# Patient Record
Sex: Male | Born: 1995
Health system: Southern US, Community
[De-identification: ages and names within clinical notes are randomized; demographics above are authoritative.]

## PROBLEM LIST (undated history)

## (undated) ENCOUNTER — Emergency Department (HOSPITAL_COMMUNITY): Admission: EM | Payer: BLUE CROSS/BLUE SHIELD | Source: Home / Self Care

## (undated) DIAGNOSIS — F909 Attention-deficit hyperactivity disorder, unspecified type: Secondary | ICD-10-CM

## (undated) DIAGNOSIS — J029 Acute pharyngitis, unspecified: Secondary | ICD-10-CM

## (undated) DIAGNOSIS — F419 Anxiety disorder, unspecified: Secondary | ICD-10-CM

## (undated) DIAGNOSIS — F32A Depression, unspecified: Secondary | ICD-10-CM

## (undated) DIAGNOSIS — F1010 Alcohol abuse, uncomplicated: Secondary | ICD-10-CM

## (undated) DIAGNOSIS — F329 Major depressive disorder, single episode, unspecified: Secondary | ICD-10-CM

## (undated) HISTORY — DX: Acute pharyngitis, unspecified: J02.9

## (undated) HISTORY — PX: WISDOM TOOTH EXTRACTION: SHX21

## (undated) HISTORY — DX: Attention-deficit hyperactivity disorder, unspecified type: F90.9

---
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
## 2015-07-13 ENCOUNTER — Ambulatory Visit (INDEPENDENT_AMBULATORY_CARE_PROVIDER_SITE_OTHER): Payer: BLUE CROSS/BLUE SHIELD | Admitting: Physician Assistant

## 2015-07-13 ENCOUNTER — Encounter: Payer: Self-pay | Admitting: Physician Assistant

## 2015-07-13 VITALS — BP 127/64 | HR 97 | Temp 98.1°F | Resp 16 | Ht 68.5 in | Wt 160.0 lb

## 2015-07-13 DIAGNOSIS — B9689 Other specified bacterial agents as the cause of diseases classified elsewhere: Secondary | ICD-10-CM | POA: Insufficient documentation

## 2015-07-13 DIAGNOSIS — J00 Acute nasopharyngitis [common cold]: Secondary | ICD-10-CM | POA: Diagnosis not present

## 2015-07-13 DIAGNOSIS — J208 Acute bronchitis due to other specified organisms: Principal | ICD-10-CM

## 2015-07-13 MED ORDER — AZITHROMYCIN 250 MG PO TABS: ORAL_TABLET | ORAL | Status: DC

## 2015-07-13 MED ORDER — BENZONATATE 200 MG PO CAPS: 200.0000 mg | ORAL_CAPSULE | Freq: Three times a day (TID) | ORAL | Status: DC | PRN

## 2015-07-13 NOTE — Assessment & Plan Note (Signed)
Rx Azithromycin.  Increase fluids.  Rest.  Saline nasal spray.  Probiotic.  Mucinex as directed.  Humidifier in bedroom. Tessalon for cough.  [place] or return to clinic if symptoms are not improving.  

## 2015-07-13 NOTE — Progress Notes (Signed)
   Patient presents to clinic today c/o 1 week of chest congestion, cough, nasal congestion and fatigue. Endorses significant PND and sore throat. Denies fever, recent travel, or sick contact.  Has been taking Mucinex with little relief in symptoms.  Past Medical History  Diagnosis Date  . Depression   . Anxiety   . ADHD (attention deficit hyperactivity disorder)     No current outpatient prescriptions on file prior to visit.   No current facility-administered medications on file prior to visit.    No Known Allergies  Family History  Problem Relation Age of Onset  . Healthy Mother     Living  . Healthy Father     Living  . Heart attack Maternal Grandfather   . Healthy Sister     x1  . Bone cancer Paternal Grandfather     Social History   Social History  . Marital Status: Single    Spouse Name: N/A  . Number of Children: N/A  . Years of Education: N/A   Social History Main Topics  . Smoking status: Current Every Day Smoker -- 0.50 packs/day    Types: Cigarettes  . Smokeless tobacco: Never Used  . Alcohol Use: 0.0 oz/week    0 Standard drinks or equivalent per week  . Drug Use: 4.00 per week    Special: Marijuana     Comment: prescription opiates, benzos, antidepressants, amphetamines  . Sexual Activity: Yes    Birth Control/ Protection: Condom   Other Topics Concern  . None   Social History Narrative   Review of Systems - See HPI.  All other ROS are negative.  BP 127/64 mmHg  Pulse 97  Temp(Src) 98.1 F (36.7 C) (Oral)  Resp 16  Ht 5' 8.5" (1.74 m)  Wt 160 lb (72.576 kg)  BMI 23.97 kg/m2  SpO2 99%  Physical Exam  Constitutional: He is oriented to person, place, and time and well-developed, well-nourished, and in no distress.  HENT:  Head: Normocephalic and atraumatic.  Right Ear: External ear normal.  Left Ear: External ear normal.  Nose: Nose normal.  Mouth/Throat: Oropharynx is clear and moist. No oropharyngeal exudate.  TM within normal  limits bilaterally.  Eyes: Conjunctivae are normal.  Neck: Neck supple.  Cardiovascular: Normal rate, regular rhythm, normal heart sounds and intact distal pulses.   Pulmonary/Chest: Effort normal and breath sounds normal. No respiratory distress. He has no wheezes. He has no rales. He exhibits no tenderness.  Neurological: He is alert and oriented to person, place, and time.  Skin: Skin is warm and dry. No rash noted.  Psychiatric: Affect normal.  Vitals reviewed.   No results found for this or any previous visit (from the past 2160 hour(s)).  Assessment/Plan: Acute bacterial bronchitis Rx Azithromycin.  Increase fluids.  Rest.  Saline nasal spray.  Probiotic.  Mucinex as directed.  Humidifier in bedroom. Tessalon for cough.  [place] or return to clinic if symptoms are not improving.

## 2015-07-13 NOTE — Patient Instructions (Signed)
Take antibiotic (Azithromycin) as directed.  Increase fluids.  Get plenty of rest. Use Tessalon as directed for cough. Take a daily probiotic (I recommend Align or Culturelle, but even Activia Yogurt may be beneficial).  A humidifier placed in the bedroom may offer some relief for a dry, scratchy throat of nasal irritation.  Read information below on acute bronchitis. Please [place] or return to clinic if symptoms are not improving.  Acute Bronchitis Bronchitis is when the airways that extend from the windpipe into the lungs get red, puffy, and painful (inflamed). Bronchitis often causes thick spit (mucus) to develop. This leads to a cough. A cough is the most common symptom of bronchitis. In acute bronchitis, the condition usually begins suddenly and goes away over time (usually in 2 weeks). Smoking, allergies, and asthma can make bronchitis worse. Repeated episodes of bronchitis may cause more lung problems.  HOME CARE  Rest.  Drink enough fluids to keep your pee (urine) clear or pale yellow (unless you need to limit fluids as told by your doctor).  Only take over-the-counter or prescription medicines as told by your doctor.  Avoid smoking and secondhand smoke. These can make bronchitis worse. If you are a smoker, think about using nicotine gum or skin patches. Quitting smoking will help your lungs heal faster.  Reduce the chance of getting bronchitis again by:  Washing your hands often.  Avoiding people with cold symptoms.  Trying not to touch your hands to your mouth, nose, or eyes.  Follow up with your doctor as told.  GET HELP IF: Your symptoms do not improve after 1 week of treatment. Symptoms include:  Cough.  Fever.  Coughing up thick spit.  Body aches.  Chest congestion.  Chills.  Shortness of breath.  Sore throat.  GET HELP RIGHT AWAY IF:   You have an increased fever.  You have chills.  You have severe shortness of breath.  You have bloody thick spit  (sputum).  You throw up (vomit) often.  You lose too much body fluid (dehydration).  You have a severe headache.  You faint.  MAKE SURE YOU:   Understand these instructions.  Will watch your condition.  Will get help right away if you are not doing well or get worse. Document Released: 02/15/2008 Document Revised: 05/01/2013 Document Reviewed: 02/19/2013 Exit[employer] Patient Information 2015 [employer]. This information is not intended to replace advice given to you by your health care provider. Make sure you discuss any questions you have with your health care provider.   

## 2015-07-13 NOTE — Progress Notes (Signed)
Pre visit review using our clinic review tool, if applicable. No additional management support is needed unless otherwise documented below in the visit note/SLS  

## 2015-09-01 ENCOUNTER — Telehealth: Payer: Self-pay | Admitting: Physician Assistant

## 2015-09-01 NOTE — Telephone Encounter (Signed)
Noted in HM. 

## 2015-09-01 NOTE — Telephone Encounter (Signed)
Pt declined having his flu vac

## 2015-12-30 ENCOUNTER — Ambulatory Visit: Payer: Self-pay | Admitting: Physician Assistant

## 2015-12-30 ENCOUNTER — Emergency Department (HOSPITAL_BASED_OUTPATIENT_CLINIC_OR_DEPARTMENT_OTHER): Payer: BLUE CROSS/BLUE SHIELD

## 2015-12-30 ENCOUNTER — Ambulatory Visit: Payer: BLUE CROSS/BLUE SHIELD | Admitting: Physician Assistant

## 2015-12-30 ENCOUNTER — Telehealth: Payer: Self-pay | Admitting: Physician Assistant

## 2015-12-30 ENCOUNTER — Emergency Department (HOSPITAL_BASED_OUTPATIENT_CLINIC_OR_DEPARTMENT_OTHER)
Admission: EM | Admit: 2015-12-30 | Discharge: 2015-12-30 | Disposition: A | Discharge status: Home / Self Care | Payer: BLUE CROSS/BLUE SHIELD | Attending: Emergency Medicine | Admitting: Emergency Medicine

## 2015-12-30 ENCOUNTER — Other Ambulatory Visit: Payer: Self-pay

## 2015-12-30 ENCOUNTER — Encounter (HOSPITAL_BASED_OUTPATIENT_CLINIC_OR_DEPARTMENT_OTHER): Payer: Self-pay

## 2015-12-30 DIAGNOSIS — R112 Nausea with vomiting, unspecified: Secondary | ICD-10-CM | POA: Diagnosis not present

## 2015-12-30 DIAGNOSIS — J069 Acute upper respiratory infection, unspecified: Secondary | ICD-10-CM | POA: Diagnosis not present

## 2015-12-30 DIAGNOSIS — R0789 Other chest pain: Secondary | ICD-10-CM

## 2015-12-30 DIAGNOSIS — Z8659 Personal history of other mental and behavioral disorders: Secondary | ICD-10-CM | POA: Insufficient documentation

## 2015-12-30 DIAGNOSIS — F17200 Nicotine dependence, unspecified, uncomplicated: Secondary | ICD-10-CM | POA: Diagnosis not present

## 2015-12-30 DIAGNOSIS — R05 Cough: Secondary | ICD-10-CM | POA: Diagnosis present

## 2015-12-30 HISTORY — DX: Alcohol abuse, uncomplicated: F10.10

## 2015-12-30 LAB — CBC WITH DIFFERENTIAL/PLATELET
Basophils Absolute: 0 10*3/uL (ref 0.0–0.1)
Basophils Relative: 0 %
EOS PCT: 0 %
Eosinophils Absolute: 0 10*3/uL (ref 0.0–0.7)
HCT: 45.3 % (ref 39.0–52.0)
HEMOGLOBIN: 16.7 g/dL (ref 13.0–17.0)
LYMPHS ABS: 1.5 10*3/uL (ref 0.7–4.0)
LYMPHS PCT: 14 %
MCH: 31.4 pg (ref 26.0–34.0)
MCHC: 36.9 g/dL — AB (ref 30.0–36.0)
MCV: 85.2 fL (ref 78.0–100.0)
MONOS PCT: 12 %
Monocytes Absolute: 1.4 10*3/uL — ABNORMAL HIGH (ref 0.1–1.0)
Neutro Abs: 8.2 10*3/uL — ABNORMAL HIGH (ref 1.7–7.7)
Neutrophils Relative %: 74 %
Platelets: 248 10*3/uL (ref 150–400)
RBC: 5.32 MIL/uL (ref 4.22–5.81)
RDW: 11.4 % — ABNORMAL LOW (ref 11.5–15.5)
WBC: 11 10*3/uL — ABNORMAL HIGH (ref 4.0–10.5)

## 2015-12-30 LAB — COMPREHENSIVE METABOLIC PANEL
ALT: 24 U/L (ref 17–63)
AST: 25 U/L (ref 15–41)
Albumin: 4.8 g/dL (ref 3.5–5.0)
Alkaline Phosphatase: 61 U/L (ref 38–126)
Anion gap: 11 (ref 5–15)
BILIRUBIN TOTAL: 0.8 mg/dL (ref 0.3–1.2)
BUN: 11 mg/dL (ref 6–20)
CO2: 26 mmol/L (ref 22–32)
CREATININE: 0.95 mg/dL (ref 0.61–1.24)
Calcium: 9.3 mg/dL (ref 8.9–10.3)
Chloride: 99 mmol/L — ABNORMAL LOW (ref 101–111)
GFR calc Af Amer: >60 mL/min (ref >60)
Glucose, Bld: 90 mg/dL (ref 65–99)
Potassium: 3.5 mmol/L (ref 3.5–5.1)
Sodium: 136 mmol/L (ref 135–145)
TOTAL PROTEIN: 8.1 g/dL (ref 6.5–8.1)

## 2015-12-30 LAB — D-DIMER, QUANTITATIVE: D-Dimer, Quant: <0.27 ug/mL-FEU (ref 0.00–0.50)

## 2015-12-30 LAB — TROPONIN I

## 2015-12-30 MED ORDER — KETOROLAC TROMETHAMINE 30 MG/ML IJ SOLN
30.0000 mg | Freq: Once | INTRAMUSCULAR | Status: AC
  Administered 2015-12-30: 30 mg via INTRAVENOUS
  Filled 2015-12-30: qty 1

## 2015-12-30 MED ORDER — NAPROXEN 500 MG PO TABS: 500.0000 mg | ORAL_TABLET | Freq: Two times a day (BID) | ORAL | Status: DC

## 2015-12-30 MED ORDER — SODIUM CHLORIDE 0.9 % IV BOLUS (SEPSIS)
1000.0000 mL | Freq: Once | INTRAVENOUS | Status: AC
  Administered 2015-12-30: 1000 mL via INTRAVENOUS

## 2015-12-30 MED FILL — NAPROXEN 500 MG TABLET: 500 | 7 days supply | Qty: 14 | Fill #0

## 2015-12-30 NOTE — ED Provider Notes (Addendum)
CSN: [id number]     Arrival date & time 12/30/15  1150 History   First MD Initiated Contact with Patient 12/30/15 1336     Chief Complaint  Patient presents with  . Cough     (Consider location/radiation/quality/duration/timing/severity/associated sxs/prior Treatment) HPI  20 year old male presents with a chief complaint of chest pain. Patient states he's been having flulike symptoms over last several days. Last couple days, especially last night he's been having chest pain. It is in the middle and left side of his chest. It is sharp in nature. When he is taken a deep breath for the x-ray he states his pain was worse. No exertional pain. Has not taken anything for the pain. Feels like someone stabbing him in the chest. No shortness of breath. Started off with sore throat, myalgias, and mild dry cough. Patient states she's been having nausea and vomiting for several months. Currently his chest is worse today than it was yesterday. No radiation of the pain.  Past Medical History  Diagnosis Date  . Depression   . Anxiety   . ADHD (attention deficit hyperactivity disorder)   . ETOH abuse    Past Surgical History  Procedure Laterality Date  . Wisdom tooth extraction     Family History  Problem Relation Age of Onset  . Healthy Mother     Living  . Healthy Father     Living  . Heart attack Maternal Grandfather   . Healthy Sister     x1  . Bone cancer Paternal Grandfather    Social History  Substance Use Topics  . Smoking status: Current Every Day Smoker -- 0.00 packs/day  . Smokeless tobacco: Never Used  . Alcohol Use: No    Review of Systems  Constitutional: Positive for fever.  HENT: Positive for congestion and sore throat.   Respiratory: Positive for cough. Negative for shortness of breath.   Cardiovascular: Positive for chest pain. Negative for leg swelling.  Gastrointestinal: Positive for nausea and vomiting. Negative for abdominal pain.  Musculoskeletal: Negative for  back pain.  All other systems reviewed and are negative.     Allergies  Review of patient's allergies indicates no known allergies.  Home Medications   Prior to Admission medications   Not on File   BP 122/85 mmHg  Pulse 86  Temp(Src) 98.4 F (36.9 C) (Oral)  Resp 18  Ht  (1.803 m)  Wt 160 lb (72.576 kg)  BMI 22.33 kg/m2  SpO2 99% Physical Exam  Constitutional: He is oriented to person, place, and time. He appears well-developed and well-nourished.  HENT:  Head: Normocephalic and atraumatic.  Right Ear: External ear normal.  Left Ear: External ear normal.  Nose: Nose normal.  Mouth/Throat: Oropharynx is clear and moist. No posterior oropharyngeal erythema or tonsillar abscesses.  Eyes: Right eye exhibits no discharge. Left eye exhibits no discharge.  Neck: Neck supple.  Cardiovascular: Normal rate, regular rhythm, normal heart sounds and intact distal pulses.   Pulmonary/Chest: Effort normal and breath sounds normal. He exhibits tenderness.    Abdominal: Soft. There is no tenderness.  Musculoskeletal: He exhibits no edema.  Neurological: He is alert and oriented to person, place, and time.  Skin: Skin is warm and dry.  Nursing note and vitals reviewed.   ED Course  Procedures (including critical care time) Labs Review Labs Reviewed  COMPREHENSIVE METABOLIC PANEL - Abnormal; Notable for the following:    Chloride 99 (*)    All other components within normal limits  CBC WITH DIFFERENTIAL/PLATELET - Abnormal; Notable for the following:    WBC 11.0 (*)    MCHC 36.9 (*)    RDW 11.4 (*)    Neutro Abs 8.2 (*)    Monocytes Absolute 1.4 (*)    All other components within normal limits  TROPONIN I  D-DIMER, QUANTITATIVE (NOT AT [facility])    Imaging Review Dg Chest 2 View  12/30/2015  CLINICAL DATA:  Fever, cough, chest pain EXAM: CHEST  2 VIEW COMPARISON:  None. FINDINGS: Lungs are clear.  No pleural effusion or pneumothorax. The heart is normal in size.  Visualized osseous structures are within normal limits. IMPRESSION: Normal chest radiographs. Electronically Signed   By: Charline BillsSriyesh  Krishnan M.D.   On: 12/30/2015 13:07   I have personally reviewed and evaluated these images and lab results as part of my medical decision-making.   EKG Interpretation   Date/Time:  Wednesday December 30 2015 11:53:50 EDT Ventricular Rate:  87 PR Interval:  162 QRS Duration: 88 QT Interval:  328 QTC Calculation: 394 R Axis:   74 Text Interpretation:  Normal sinus rhythm with sinus arrhythmia ST \\T \ T  wave abnormality, consider inferior ischemia Abnormal ECG changes noted  compared to Oct 2015 Confirmed by Criss AlvineGOLDSTON  MD, Jeromie Gainor [phone]([id number]) on 12/30/2015  12:06:50 PM       EKG Interpretation  Date/Time:  Wednesday December 30 2015 13:59:21 EDT Ventricular Rate:  78 PR Interval:  150 QRS Duration: 90 QT Interval:  334 QTC Calculation: 380 R Axis:   80 Text Interpretation:  Sinus rhythm Minimal ST depression, diffuse leads similar to ECG eaerlier in the day Confirmed by Jessie Cowher  MD, Genie Wenke ([id number]) on 12/30/2015 2:37:10 PM       MDM   Final diagnoses:  Chest wall pain  Upper respiratory infection    Patient's CP is atypical and appears to be coming from his chest wall. Likely from a costochondritis given recent URI/flu like illness. CP has been present for over 24 hours and has a negative troponin, do not feel further troponins are necessary. Is low risk for ACS, HEART score is 2. His EKG is not normal but nonspecific and not changing. I doubt ischemia. Low risk for PE, no risk factors, and ddimer is negative. Feels better with toradol. Will treat with NSAIDs, and recommend f/u with PCP. Discussed return precautions.    Pricilla LovelessScott Saroya Riccobono, MD 12/30/15 1514  Pricilla LovelessScott Taylyn Brame, MD 12/30/15 [phone]

## 2015-12-30 NOTE — ED Notes (Signed)
C/o "flu symptoms"-prod cough x last week fever yesterday-c/o CP x today-seen at urgent care for n/v x 3 days-stated on zofran and omeprazole-appt with PCP at 1:15pm but came to ED-NAD-steady gait

## 2015-12-30 NOTE — ED Notes (Signed)
Pt given d/c instructions as per chart. Verbalizes understanding. No questions. Rx x 1 

## 2015-12-30 NOTE — Telephone Encounter (Signed)
He would need to stay in the ER if they are already assessing. If he is in significant pain then there is a lot more they can do quickly for him there.

## 2015-12-30 NOTE — ED Notes (Signed)
Cough x couple days. Not able to eat or drink anything without gagging and/or vomiting sometimes for several months. [facility] and given Zofran and Omeprazole.

## 2015-12-30 NOTE — Telephone Encounter (Signed)
Caller name:Schulenburg,Bridget Relation to pt: mother  [place] back number:[phone]   Reason for [place]:  Mother called cancelling patient 1:15pm appointment due to patient being seen in the ED now and mother would like to know if PCP can intervene because its taking an hour for MRI results to come back and mother states patient is in pain.

## 2015-12-30 NOTE — Telephone Encounter (Signed)
LVM advising mother of message below

## 2015-12-31 ENCOUNTER — Encounter: Payer: Self-pay | Admitting: Internal Medicine

## 2015-12-31 ENCOUNTER — Ambulatory Visit (INDEPENDENT_AMBULATORY_CARE_PROVIDER_SITE_OTHER): Payer: BLUE CROSS/BLUE SHIELD | Admitting: Family Medicine

## 2015-12-31 ENCOUNTER — Encounter: Payer: Self-pay | Admitting: Family Medicine

## 2015-12-31 VITALS — BP 110/80 | HR 81 | Temp 97.8°F | Ht 68.5 in | Wt 175.0 lb

## 2015-12-31 DIAGNOSIS — R1310 Dysphagia, unspecified: Secondary | ICD-10-CM

## 2015-12-31 DIAGNOSIS — K219 Gastro-esophageal reflux disease without esophagitis: Secondary | ICD-10-CM | POA: Diagnosis not present

## 2015-12-31 DIAGNOSIS — F322 Major depressive disorder, single episode, severe without psychotic features: Secondary | ICD-10-CM

## 2015-12-31 MED ORDER — SUCRALFATE 1 G PO TABS: 1.0000 g | ORAL_TABLET | Freq: Three times a day (TID) | ORAL | Status: DC

## 2015-12-31 NOTE — Progress Notes (Signed)
[facility] [address] [place], [place] [address] [phone]  Date:  12/31/2015   Name:  Todd Vaughn   DOB:  1996/03/22   MRN:  [id number]  PCP:  Piedad Climes, PA-C    Chief Complaint: Follow-up and GI Problem   History of Present Illness:  Todd Vaughn is a 20 y.o. very pleasant male patient who presents with the following:  Pt was in the ER yesterday with complaint of chest pain and flu like sx.  EKG mildly abnl- he was asked to come in and see [place] today for further evaluation.    Yesterday had negative troponin an D dimer.  He also has concerns about GI problems and his mental health, as below.  Accompanied by his mom and grandmother today.   He is concerned about his GI system- he has noted some difficulty with swallowing over a couple of motnhs.  He does not feel like things get stuck per se but it can be just hard to get food and also liquids down sometimes.  He has also noted some gagging and vomiting daily for the last 2-3 months.  He does not have diarrhea Weight is stable. In fact he has gained a bit over the last 6 months.  He may notice some epigastric pain at time  He does have a history of anxiety and depression.   He has discussed this with his pediatrician and with Selena Batten in the past. He has done some therapy but is not in therapy currently. He has been on prozac, zoloft, ?buspar, wellbutrin in the past. He has had mood issues since a pretty young age- 74 or 59.  He had been in [facility] a couple of years ago- he took an overdose but states that he was not really trying to hurt himself.  "it was my way of asking for help"  He has not seen a psychiatrist ever but they would like to do this They do not have guns at home.  He denies active SI  Right now he is not in school or working.  He graduated high school last year. Hopes to begin working again soon. 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   There is a history of depression on his dad's side of the family.    He has a good support system- he indicates his mother, father and his GM as people he can count on.    Patient Active Problem List   Diagnosis Date Noted  . Acute bacterial bronchitis 07/13/2015  . Anxiety and depression 12/02/2014  . ADHD (attention deficit hyperactivity disorder), combined type 06/14/2014  . Cannabis use disorder, moderate, dependence (HCC) 06/14/2014    Past Medical History  Diagnosis Date  . Depression   . Anxiety   . ADHD (attention deficit hyperactivity disorder)   . ETOH abuse     Past Surgical History  Procedure Laterality Date  . Wisdom tooth extraction      Social History  Substance Use Topics  . Smoking status: Current Every Day Smoker -- 0.00 packs/day  . Smokeless tobacco: Never Used  . Alcohol Use: No    Family History  Problem Relation Age of Onset  . Healthy Mother     Living  . Healthy Father     Living  . Heart attack Maternal Grandfather   . Healthy Sister  x1  . Bone cancer Paternal Grandfather     No Known Allergies  Medication list has been reviewed and updated.  Current Outpatient Prescriptions on File Prior to Visit  Medication Sig Dispense Refill  . naproxen (NAPROSYN) 500 MG tablet Take 1 tablet (500 mg total) by mouth 2 (two) times daily with a meal. 14 tablet 0   No current facility-administered medications on file prior to visit.    Review of Systems:  As per HPI- otherwise negative.   Physical Examination: Filed Vitals:   12/31/15 1130  BP: 110/80  Pulse: 81  Temp: 97.8 F (36.6 C)   Filed Vitals:   12/31/15 1130  Height: 5' 8.5" (1.74 m)  Weight: 175 lb (79.379 kg)   Body mass index is 26.22 kg/(m^2). Ideal Body Weight: Weight in (lb) to have BMI = 25: 166.5  GEN: WDWN, NAD, Non-toxic, A & O x 3, normal weight, appears healthy HEENT: Atraumatic, Normocephalic. Neck supple. No masses,  No LAD.  Bilateral TM wnl, oropharynx normal.  PEERL,EOMI.   Ears and Nose: No external deformity. CV: RRR, No M/G/R. No JVD. No thrill. No extra heart sounds. PULM: CTA B, no wheezes, crackles, rhonchi. No retractions. No resp. distress. No accessory muscle use. ABD: S, NT, ND, +BS. No rebound. No HSM. EXTR: No c/c/e. Evidence of self- cutting left wrist, old scars only NEURO Normal gait.  PSYCH: Normally interactive. Conversant. Not depressed or anxious appearing.  Calm demeanor.   EKG:NSR, normal tracing today  Assessment and Plan: Gastroesophageal reflux disease, esophagitis presence not specified - Plan: Ambulatory referral to Gastroenterology, DISCONTINUED: sucralfate (CARAFATE) 1 g tablet  Dysphagia - Plan: Ambulatory referral to Gastroenterology  MDD (major depressive disorder), severe (HCC)  Will refer to GI for further evaluation of his long- standing stomach concerns.  These may be related to his depression/ anxiety.  Will have him start OTC zantac for now   I was able to find a psychiatry group in [place] who can see him in short order.  Called his mom and she will Versteeg them back and schedule asap  Signed Abbe AmsterdamJessica Copland, MD

## 2015-12-31 NOTE — Progress Notes (Signed)
Pre visit review using our clinic tool,if applicable. No additional management support is needed unless otherwise documented below in the visit note.  

## 2015-12-31 NOTE — Patient Instructions (Signed)
We will find you a psychiatrist asap. If you have any problems with suicidal ideas in the meantime please seek help right away!    I will make some phone calls for you and will Sissel you later on today  For your stomach concerns, I will refer you to a GI doctor. Also purchase a box of OTC zantac and take it for 2 weeks.

## 2016-01-13 ENCOUNTER — Encounter: Payer: Self-pay | Admitting: Medical

## 2016-01-13 ENCOUNTER — Ambulatory Visit (INDEPENDENT_AMBULATORY_CARE_PROVIDER_SITE_OTHER): Payer: BLUE CROSS/BLUE SHIELD | Admitting: Medical

## 2016-01-13 VITALS — BP 108/80 | HR 110 | Temp 99.5°F | Ht 68.5 in | Wt 172.4 lb

## 2016-01-13 DIAGNOSIS — R059 Cough, unspecified: Secondary | ICD-10-CM

## 2016-01-13 DIAGNOSIS — J029 Acute pharyngitis, unspecified: Secondary | ICD-10-CM

## 2016-01-13 DIAGNOSIS — R05 Cough: Secondary | ICD-10-CM

## 2016-01-13 LAB — POCT RAPID STREP A (OFFICE): RAPID STREP A SCREEN: NEGATIVE

## 2016-01-13 MED ORDER — GUAIFENESIN-CODEINE 100-10 MG/5ML PO SOLN: ORAL | Status: DC

## 2016-01-13 MED ORDER — BENZONATATE 200 MG PO CAPS: 200.0000 mg | ORAL_CAPSULE | Freq: Three times a day (TID) | ORAL | Status: DC | PRN

## 2016-01-13 MED ORDER — AZITHROMYCIN 250 MG PO TABS: ORAL_TABLET | ORAL | Status: DC

## 2016-01-13 MED FILL — BENZONATATE 200 MG CAPSULE: 200 | 7 days supply | Qty: 21 | Fill #0

## 2016-01-13 MED FILL — AZITHROMYCIN 250 MG TABLET: 250 | 5 days supply | Qty: 6 | Fill #0

## 2016-01-13 MED FILL — GUAIATUSSIN AC LIQUID: 100-10 | 5 days supply | Qty: 200 | Fill #0

## 2016-01-13 NOTE — Patient Instructions (Addendum)
For pharyngitis will treat for strep. Rx Azithromycin antibiotic.  For possible rt om. Azithromycin  will help as well.  For cough benzonatate.  Rest hydrate and tylenol for fever.  Follow up in 7 days or as needed

## 2016-01-13 NOTE — Progress Notes (Signed)
Subjective:    Patient ID: Todd Vaughn, male    DOB: [birth year], 20 y.o.   MRN: [id number]  HPI  Pt in for st, body aches, fatigue and ha for 3 days. Some cough as well.   Pt states mom had strep toward end of last month.    Review of Systems  Constitutional: Negative for fever, chills and fatigue.  HENT: Negative for congestion.   Respiratory: Positive for cough. Negative for chest tightness, shortness of breath and wheezing.   Cardiovascular: Negative for chest pain and palpitations.  Musculoskeletal: Positive for myalgias. Negative for back pain and neck pain.       At night aches.  Psychiatric/Behavioral: Negative for behavioral problems and confusion.    Past Medical History  Diagnosis Date  . Depression   . Anxiety   . ADHD (attention deficit hyperactivity disorder)   . ETOH abuse      Social History   Social History  . Marital Status: Single    Spouse Name: N/A  . Number of Children: N/A  . Years of Education: N/A   Occupational History  . Not on file.   Social History Main Topics  . Smoking status: Current Every Day Smoker -- 0.00 packs/day  . Smokeless tobacco: Never Used  . Alcohol Use: No  . Drug Use: 4.00 per week    Special: Marijuana  . Sexual Activity: Yes    Birth Control/ Protection: Condom   Other Topics Concern  . Not on file   Social History Narrative    Past Surgical History  Procedure Laterality Date  . Wisdom tooth extraction      Family History  Problem Relation Age of Onset  . Healthy Mother     Living  . Healthy Father     Living  . Heart attack Maternal Grandfather   . Healthy Sister     x1  . Bone cancer Paternal Grandfather     No Known Allergies  No current outpatient prescriptions on file prior to visit.   No current facility-administered medications on file prior to visit.    BP 108/80 mmHg  Pulse 110  Temp(Src) 99.5 F (37.5 C) (Oral)  Ht 5' 8.5" (1.74 m)  Wt 172 lb 6.4 oz (78.2 kg)  BMI 25.83  kg/m2  SpO2 98%       Objective:   Physical Exam  General  Mental Status - Alert. General Appearance - Well groomed. Not in acute distress.  Skin Rashes- No Rashes.  HEENT Head- Normal. Ear Auditory Canal - Left- Normal. Right - Normal.Tympanic Membrane- Left- mild red. Right- Normal. Eye Sclera/Conjunctiva- Left- Normal. Right- Normal. Nose & Sinuses Nasal Mucosa- Left-  Boggy and Congested. Right-  Boggy and  Congested.Bilateral maxillary and frontal sinus pressure. Mouth & Throat Lips: Upper Lip- Normal: no dryness, cracking, pallor, cyanosis, or vesicular eruption. Lower Lip-Normal: no dryness, cracking, pallor, cyanosis or vesicular eruption. Buccal Mucosa- Bilateral- No Aphthous ulcers. Oropharynx- No Discharge or Erythema. Tonsils: Characteristics- Bilateral- Erythema and  Congestion. Size/Enlargement- Bilateral- 2+  enlargement. Discharge- bilateral-None.  Neck Neck- Supple. No Masses.   Chest and Lung Exam Auscultation: Breath Sounds:-Clear even and unlabored.  Cardiovascular Auscultation:Rythm- Regular, rate and rhythm. Murmurs & Other Heart Sounds:Ausculatation of the heart reveal- No Murmurs.  Lymphatic Head & Neck General Head & Neck Lymphatics: Bilateral: Description- No Localized lymphadenopathy. Rt side submandibular node swollen and tender  Abdomen- soft non-tender .nondistended +bs. No splenomegaly     Assessment & Plan:  For  pharyngitis will treat for strep. Rx Azithromycin antibiotic. Rapid strep negative but treating based on exam and history.  For possible rt om. Azithromycin  will help as well.  For cough benzonatate.  Rest hydrate and tylenol for fever.  Follow up in 7 days or as needed  I called pt and told him about negative strep. He states benzonatate has never helped him for cough. I reviewed his med records he is no longer using marijuana or drinking alchohol. Decided to rx robitussin AC. Rx advisement given.  Rosalinda Seaman, Todd DredgeEdward,  PA-C

## 2016-01-13 NOTE — Progress Notes (Signed)
Pre visit review using our clinic review tool, if applicable. No additional management support is needed unless otherwise documented below in the visit note. 

## 2016-01-14 ENCOUNTER — Ambulatory Visit: Payer: Self-pay | Admitting: Medical

## 2016-01-15 ENCOUNTER — Encounter: Payer: Self-pay | Admitting: Family Medicine

## 2016-01-15 ENCOUNTER — Telehealth: Payer: Self-pay | Admitting: Physician Assistant

## 2016-01-15 ENCOUNTER — Ambulatory Visit (INDEPENDENT_AMBULATORY_CARE_PROVIDER_SITE_OTHER): Payer: BLUE CROSS/BLUE SHIELD | Admitting: Family Medicine

## 2016-01-15 VITALS — BP 108/70 | HR 94 | Temp 99.3°F | Ht 68.5 in | Wt 170.2 lb

## 2016-01-15 DIAGNOSIS — R509 Fever, unspecified: Secondary | ICD-10-CM

## 2016-01-15 DIAGNOSIS — J020 Streptococcal pharyngitis: Secondary | ICD-10-CM | POA: Diagnosis not present

## 2016-01-15 DIAGNOSIS — J029 Acute pharyngitis, unspecified: Secondary | ICD-10-CM

## 2016-01-15 DIAGNOSIS — R6883 Chills (without fever): Secondary | ICD-10-CM

## 2016-01-15 DIAGNOSIS — R591 Generalized enlarged lymph nodes: Secondary | ICD-10-CM | POA: Insufficient documentation

## 2016-01-15 DIAGNOSIS — R1013 Epigastric pain: Secondary | ICD-10-CM

## 2016-01-15 HISTORY — DX: Acute pharyngitis, unspecified: J02.9

## 2016-01-15 MED ORDER — HYDROCODONE-HOMATROPINE 5-1.5 MG/5ML PO SYRP: 5.0000 mL | ORAL_SOLUTION | Freq: Four times a day (QID) | ORAL | Status: DC | PRN

## 2016-01-15 MED ORDER — CEFDINIR 300 MG PO CAPS: 300.0000 mg | ORAL_CAPSULE | Freq: Two times a day (BID) | ORAL | Status: DC

## 2016-01-15 MED FILL — HYDROCODONE-HOMATROPINE SYR: 5-1.5 | 6 days supply | Qty: 120 | Fill #0

## 2016-01-15 MED FILL — CEFDINIR 300 MG CAPSULE: 300 | 10 days supply | Qty: 20 | Fill #0

## 2016-01-15 NOTE — Telephone Encounter (Signed)
Patient Name: Todd CanardYLER Vaughn  DOB: 01/12/1996    Initial Comment Caller states, was seen on 3rd, treated for strep throat, had a nose bleed lasting 45 min, and again this morning 20 min, also has gums swelling and bleeding since yeesterday.    Nurse Assessment  Nurse: Vickey SagesAtkins, RN, Jacquilin Date/Time (Eastern Time): 01/15/2016 10:47:58 AM  Confirm and document reason for Platter. If symptomatic, describe symptoms. You must click the next button to save text entered. ---Caller states, was seen on 3rd, treated for strep throat, had a nose bleed lasting 45 min, and again this morning 20 min, also has gums swelling and bleeding since yesterday.  Has the patient traveled out of the country within the last 30 days? ---No  Does the patient have any new or worsening symptoms? ---Yes  Will a triage be completed? ---Yes  Related visit to physician within the last 2 weeks? ---Yes  Does the PT have any chronic conditions? (i.e. diabetes, asthma, etc.) ---No  Is this a behavioral health or substance abuse [place]? ---No     Guidelines    Guideline Title Affirmed Question Affirmed Notes  Nosebleed [1] Skin bruises or bleeding gums AND [2] not caused by an injury    Final Disposition User   See Physician within 24 Hours Atkins, RN, Jacquilin    Referrals  GO TO FACILITY UNDECIDED   Disagree/Comply: Comply

## 2016-01-15 NOTE — Progress Notes (Signed)
Pre visit review using our clinic review tool, if applicable. No additional management support is needed unless otherwise documented below in the visit note. 

## 2016-01-15 NOTE — Patient Instructions (Addendum)
NOW Probiotic Vitamin. Available at [employer].   Infectious Mononucleosis Infectious mononucleosis is an infection caused by a virus. This illness is often called "mono." It causes symptoms that affect various areas of the body, including the throat, upper air passages, and lymph glands. The liver or spleen may also be affected. The virus spreads from person to person through close contact. The illness is usually not serious and often goes away in 2-4 weeks without treatment. In rare cases, symptoms can be more severe and last longer, sometimes up to several months. Because the illness can sometimes cause the liver or spleen to become enlarged, you should not participate in contact sports or strenuous exercise until your health care provider approves. CAUSES  Infectious mononucleosis is caused by the Epstein-Barr virus. This virus spreads through contact with an infected person's saliva or other bodily fluids. It is often spread through kissing. It may also spread through coughing or sharing utensils or drinking glasses that were recently used by an infected person. An infected person will not always appear ill but can still spread the virus. RISK FACTORS This illness is most common in adolescents and young adults. SIGNS AND SYMPTOMS  The most common symptoms of infectious mononucleosis are:  Sore throat.   Headache.   Fatigue.   Muscle aches.   Swollen glands.   Fever.   Poor appetite.   Enlarged liver or spleen.  Some less common symptoms that can also occur include:  Rash. This is more common if you take antibiotic medicines.  Feeling sick to your stomach (nauseous).   Abdominal pain.  DIAGNOSIS  Your health care provider will take your medical history and do a physical exam. Blood tests can be done to confirm the diagnosis.  TREATMENT  Infectious mononucleosis usually goes away on its own with time. It cannot be cured with medicines, but medicines are  sometimes used to relieve symptoms. Steroid medicine is sometimes needed if the swelling in the throat causes breathing or swallowing problems. Treatment in a hospital is sometimes needed for severe cases.  HOME CARE INSTRUCTIONS   Rest as needed.   Do not participate in contact sports, strenuous exercise, or heavy lifting until your health care provider approves. The liver and spleen could be seriously injured if they are enlarged from the illness. You may need to wait a couple months before participating in sports.   Drink enough fluid to keep your urine clear or pale yellow.   Do not drink alcohol.  Take medicines only as directed by your health care provider. Children under 66 years of age should not take aspirin because of the association with Reye syndrome.   Eat soft foods. Cold foods such as ice cream or frozen ice pops can soothe a sore throat.  If you have a sore throat, gargle with a mixture of salt and water. This may help relieve your discomfort. Mix 1 tsp of salt in 1 cup of warm water. Sucking on hard candy may also help.   Start regular activities gradually after the fever is gone. Be sure to rest when tired.   Avoid kissing or sharing utensils or drinking glasses until your health care provider tells you that you are no longer contagious.  PREVENTION  To avoid spreading the virus, do not kiss anyone or share utensils, drinking glasses, or food until your health care provider tells you that you are no longer contagious. SEEK MEDICAL CARE IF:   Your fever is not gone after 10 days.  You have swollen lymph nodes that are not back to normal after 4 weeks.  Your activity level is not back to normal after 2 months.   You have yellow coloring to your eyes and skin (jaundice).  You have constipation.  SEEK IMMEDIATE MEDICAL CARE IF:   You have severe pain in the abdomen or shoulder.  You are drooling.  You have trouble swallowing.  You have trouble  breathing.  You develop a stiff neck.  You develop a severe headache.  You cannot stop throwing up (vomiting).  You have convulsions.  You are confused.  You have trouble with balance.  You have signs of dehydration. These may include:  Weakness.  Sunken eyes.  Pale skin.  Dry mouth.  Rapid breathing or pulse.   This information is not intended to replace advice given to you by your health care provider. Make sure you discuss any questions you have with your health care provider.   Document Released: 08/26/2000 Document Revised: 09/19/2014 Document Reviewed: 05/06/2014 Elsevier Interactive Patient Education [employer]2016 [employer]vier Inc.

## 2016-01-15 NOTE — Progress Notes (Signed)
Subjective:    Patient ID: Todd Vaughn, male    DOB: [birth year], 20 y.o.   MRN: [id number]  Chief Complaint  Patient presents with  . Follow-up    HPI Patient is in today for follow up. Patient presents today with lymph nodes in throat swollen, has some ulcers in mouth and throat. Patient also reports having some heart burn now that has been ongoing for a couple months, patient also has some mild constipation. Patient also reports a nosebleed that lasted 45 minutes yesterday and reports having one this morning.  Denies CP/palp/SOB/HA/congestion/fevers/GI or GU c/o. Taking meds as prescribed. Guaifenasin/codeine caused nausea.    Past Medical History  Diagnosis Date  . Depression   . Anxiety   . ADHD (attention deficit hyperactivity disorder)   . ETOH abuse   . Acute pharyngitis 01/15/2016    Past Surgical History  Procedure Laterality Date  . Wisdom tooth extraction      Family History  Problem Relation Age of Onset  . Healthy Mother     Living  . Healthy Father     Living  . Heart attack Maternal Grandfather   . Healthy Sister     x1  . Bone cancer Paternal Grandfather     Social History   Social History  . Marital Status: Single    Spouse Name: N/A  . Number of Children: N/A  . Years of Education: N/A   Occupational History  . Not on file.   Social History Main Topics  . Smoking status: Current Every Day Smoker -- 0.00 packs/day  . Smokeless tobacco: Never Used  . Alcohol Use: No  . Drug Use: 4.00 per week    Special: Marijuana  . Sexual Activity: Yes    Birth Control/ Protection: Condom   Other Topics Concern  . Not on file   Social History Narrative    Outpatient Prescriptions Prior to Visit  Medication Sig Dispense Refill  . azithromycin (ZITHROMAX) 250 MG tablet Take 2 tablets by mouth on day 1, followed by 1 tablet by mouth daily for 4 days. 6 tablet 0  . guaiFENesin-codeine 100-10 MG/5ML syrup 5-10 ml po q 6 hours prn cogh 200 mL 0  .  benzonatate (TESSALON) 200 MG capsule Take 1 capsule (200 mg total) by mouth 3 (three) times daily as needed for cough. 21 capsule 0   No facility-administered medications prior to visit.    No Known Allergies  Review of Systems  Constitutional: Positive for fever and chills. Negative for malaise/fatigue.  HENT: Positive for nosebleeds and sore throat. Negative for congestion.   Eyes: Negative for blurred vision.  Respiratory: Negative for shortness of breath.   Cardiovascular: Negative for chest pain, palpitations and leg swelling.  Gastrointestinal: Positive for heartburn and constipation. Negative for nausea, abdominal pain and blood in stool.  Genitourinary: Negative for dysuria and frequency.  Musculoskeletal: Negative for falls.  Skin: Negative for rash.  Neurological: Negative for dizziness, loss of consciousness and headaches.  Endo/Heme/Allergies: Negative for environmental allergies.  Psychiatric/Behavioral: Negative for depression. The patient is not nervous/anxious.        Objective:    Physical Exam  Constitutional: He is oriented to person, place, and time. He appears well-developed and well-nourished. No distress.  HENT:  Head: Normocephalic and atraumatic.  2+ tonsils b/l, erythematous, crypts, dry mucous membranes  Eyes: Conjunctivae and EOM are normal.  Neck: Neck supple. No thyromegaly present.  Cardiovascular: Normal rate, regular rhythm and normal heart sounds.  No murmur heard. Pulmonary/Chest: Effort normal and breath sounds normal. No respiratory distress. He has no wheezes.  Abdominal: Soft. Bowel sounds are normal. He exhibits no mass. There is no tenderness.  Musculoskeletal: He exhibits no edema.  Lymphadenopathy:    He has cervical adenopathy.  Neurological: He is alert and oriented to person, place, and time.  Skin: Skin is warm and dry.  Psychiatric: He has a normal mood and affect. His behavior is normal.    BP 108/70 mmHg  Pulse 94   Temp(Src) 99.3 F (37.4 C) (Oral)  Ht 5' 8.5" (1.74 m)  Wt 170 lb 4 oz (77.225 kg)  BMI 25.51 kg/m2  SpO2 96% Wt Readings from Last 3 Encounters:  01/15/16 170 lb 4 oz (77.225 kg) (73 %*, Z = 0.61)  01/13/16 172 lb 6.4 oz (78.2 kg) (75 %*, Z = 0.68)  12/31/15 175 lb (79.379 kg) (78 %*, Z = 0.77)   * Growth percentiles are based on CDC 2-20 Years data.     Lab Results  Component Value Date   WBC 11.0* 12/30/2015   HGB 16.7 12/30/2015   HCT 45.3 12/30/2015   PLT 248 12/30/2015   GLUCOSE 90 12/30/2015   CHOL 111 06/15/2014   TRIG 87 06/15/2014   HDL 38 06/15/2014   LDLCALC 56 06/15/2014   ALT 24 12/30/2015   AST 25 12/30/2015   NA 136 12/30/2015   K 3.5 12/30/2015   CL 99* 12/30/2015   CREATININE 0.95 12/30/2015   BUN 11 12/30/2015   CO2 26 12/30/2015   TSH 1.190 06/15/2014    Lab Results  Component Value Date   TSH 1.190 06/15/2014   Lab Results  Component Value Date   WBC 11.0* 12/30/2015   HGB 16.7 12/30/2015   HCT 45.3 12/30/2015   MCV 85.2 12/30/2015   PLT 248 12/30/2015   Lab Results  Component Value Date   NA 136 12/30/2015   K 3.5 12/30/2015   CO2 26 12/30/2015   GLUCOSE 90 12/30/2015   BUN 11 12/30/2015   CREATININE 0.95 12/30/2015   BILITOT 0.8 12/30/2015   ALKPHOS 61 12/30/2015   AST 25 12/30/2015   ALT 24 12/30/2015   PROT 8.1 12/30/2015   ALBUMIN 4.8 12/30/2015   CALCIUM 9.3 12/30/2015   ANIONGAP 11 12/30/2015   Lab Results  Component Value Date   CHOL 111 06/15/2014   Lab Results  Component Value Date   HDL 38 06/15/2014   Lab Results  Component Value Date   LDLCALC 56 06/15/2014   Lab Results  Component Value Date   TRIG 87 06/15/2014   Lab Results  Component Value Date   CHOLHDL 2.9 06/15/2014   No results found for: HGBA1C     Assessment & Plan:   Problem List Items Addressed This Visit    Lymphadenopathy - Primary    Cervical, likely a viral component. Will check monospot, sed rate, cbc with diff and CMV  today. He will report if no improvement.      Relevant Medications   HYDROcodone-homatropine (HYDROMET) 5-1.5 MG/5ML syrup   Other Relevant Orders   Comp Met (CMET)   Monospot   CBC with Differential/Platelet   CMV IgM   H. pylori antibody, IgG   Sedimentation rate   Acute pharyngitis    Not responding to Azithromycin will switch to Cefdinir. Start a probiotic, appears mildly dehydrated clinically also so encouraged increased rest and hydration       Other Visit Diagnoses  Streptococcal sore throat        Relevant Medications    HYDROcodone-homatropine (HYDROMET) 5-1.5 MG/5ML syrup    cefdinir (OMNICEF) 300 MG capsule    Other Relevant Orders    Comp Met (CMET)    Monospot    CBC with Differential/Platelet    CMV IgM    H. pylori antibody, IgG    Sedimentation rate    Fever, unspecified        Relevant Medications    HYDROcodone-homatropine (HYDROMET) 5-1.5 MG/5ML syrup    Other Relevant Orders    Comp Met (CMET)    Monospot    CBC with Differential/Platelet    CMV IgM    H. pylori antibody, IgG    Sedimentation rate    Chills        Relevant Medications    HYDROcodone-homatropine (HYDROMET) 5-1.5 MG/5ML syrup    Other Relevant Orders    Comp Met (CMET)    Monospot    CBC with Differential/Platelet    CMV IgM    H. pylori antibody, IgG    Sedimentation rate    Dyspepsia        Relevant Medications    HYDROcodone-homatropine (HYDROMET) 5-1.5 MG/5ML syrup    Other Relevant Orders    Comp Met (CMET)    Monospot    CBC with Differential/Platelet    CMV IgM    H. pylori antibody, IgG    Sedimentation rate       I have discontinued Mr. Lazar benzonatate. I am also having him start on HYDROcodone-homatropine and cefdinir. Additionally, I am having him maintain his azithromycin and guaiFENesin-codeine.  Meds ordered this encounter  Medications  . HYDROcodone-homatropine (HYDROMET) 5-1.5 MG/5ML syrup    Sig: Take 5 mLs by mouth every 6 (six) hours as  needed for cough.    Dispense:  120 mL    Refill:  0  . cefdinir (OMNICEF) 300 MG capsule    Sig: Take 1 capsule (300 mg total) by mouth 2 (two) times daily.    Dispense:  20 capsule    Refill:  0     Penni Homans, MD

## 2016-01-15 NOTE — Assessment & Plan Note (Signed)
Not responding to Azithromycin will switch to Cefdinir. Start a probiotic, appears mildly dehydrated clinically also so encouraged increased rest and hydration

## 2016-01-15 NOTE — Telephone Encounter (Signed)
Pt has an appt scheduled for today (01/15/16) at 3:45 pm with Dr. Abner GreenspanBlyth

## 2016-01-15 NOTE — Assessment & Plan Note (Signed)
Cervical, likely a viral component. Will check monospot, sed rate, cbc with diff and CMV today. He will report if no improvement.

## 2016-01-15 NOTE — Telephone Encounter (Signed)
Pt called in stating he was in 5/3 and treated for strep. He isn't sure if he is having a reaction to the medication or something else. Last night had a nose bleed that took 45 min to stop. This morning had another for 45 min from other nostril. Pt is also having bleeding from multiple places in his gums. [facility]

## 2016-01-16 LAB — CBC WITH DIFFERENTIAL/PLATELET
BASOS ABS: 51 {cells}/uL (ref 0–200)
BASOS PCT: 1 %
EOS ABS: 0 {cells}/uL — AB (ref 15–500)
Eosinophils Relative: 0 %
HEMATOCRIT: 44.1 % (ref 38.5–50.0)
HEMOGLOBIN: 14.8 g/dL (ref 13.2–17.1)
Lymphocytes Relative: 35 %
Lymphs Abs: 1785 cells/uL (ref 850–3900)
MCH: 30.7 pg (ref 27.0–33.0)
MCHC: 33.6 g/dL (ref 32.0–36.0)
MCV: 91.5 fL (ref 80.0–100.0)
MONO ABS: 714 {cells}/uL (ref 200–950)
MONOS PCT: 14 %
MPV: 9.3 fL (ref 7.5–12.5)
NEUTROS ABS: 2550 {cells}/uL (ref 1500–7800)
Neutrophils Relative %: 50 %
Platelets: 228 10*3/uL (ref 140–400)
RBC: 4.82 MIL/uL (ref 4.20–5.80)
RDW: 12.8 % (ref 11.0–15.0)
WBC: 5.1 10*3/uL (ref 3.8–10.8)

## 2016-01-16 LAB — COMPREHENSIVE METABOLIC PANEL
ALT: 15 U/L (ref 8–46)
AST: 24 U/L (ref 12–32)
Albumin: 4.2 g/dL (ref 3.6–5.1)
Alkaline Phosphatase: 50 U/L (ref 48–230)
BUN: 9 mg/dL (ref 7–20)
CALCIUM: 8.8 mg/dL — AB (ref 8.9–10.4)
CO2: 28 mmol/L (ref 20–31)
Chloride: 97 mmol/L — ABNORMAL LOW (ref 98–110)
Creat: 0.95 mg/dL (ref 0.60–1.26)
Glucose, Bld: 85 mg/dL (ref 65–99)
POTASSIUM: 4 mmol/L (ref 3.8–5.1)
Sodium: 137 mmol/L (ref 135–146)
TOTAL PROTEIN: 7.1 g/dL (ref 6.3–8.2)
Total Bilirubin: 0.4 mg/dL (ref 0.2–1.1)

## 2016-01-16 LAB — SEDIMENTATION RATE: SED RATE: 4 mm/h (ref 0–15)

## 2016-01-16 LAB — MONONUCLEOSIS SCREEN: HETEROPHILE, MONO SCREEN: NEGATIVE

## 2016-01-18 ENCOUNTER — Telehealth: Payer: Self-pay

## 2016-01-18 ENCOUNTER — Other Ambulatory Visit: Payer: Self-pay

## 2016-01-18 DIAGNOSIS — R591 Generalized enlarged lymph nodes: Secondary | ICD-10-CM

## 2016-01-18 DIAGNOSIS — R509 Fever, unspecified: Secondary | ICD-10-CM

## 2016-01-18 NOTE — Telephone Encounter (Signed)
Ordered the h. Pylori tes again patient was seen Friday and had test done, [facility] could not do this test only [facility] runs this test so i notified paitent to see if he could come in again to get the test ran today, pateint agreed and reports he had apt tomorrow and that he would get the blood work then, patient verbalized understanding and i placed the lab as future and patient will come in to get test done tomorrow.

## 2016-01-19 ENCOUNTER — Encounter: Payer: Self-pay | Admitting: Physician Assistant

## 2016-01-19 ENCOUNTER — Ambulatory Visit (INDEPENDENT_AMBULATORY_CARE_PROVIDER_SITE_OTHER): Payer: BLUE CROSS/BLUE SHIELD | Admitting: Physician Assistant

## 2016-01-19 VITALS — BP 116/86 | HR 108 | Temp 97.5°F | Resp 16 | Ht 68.5 in | Wt 167.1 lb

## 2016-01-19 DIAGNOSIS — R591 Generalized enlarged lymph nodes: Secondary | ICD-10-CM

## 2016-01-19 DIAGNOSIS — B002 Herpesviral gingivostomatitis and pharyngotonsillitis: Secondary | ICD-10-CM

## 2016-01-19 DIAGNOSIS — B0089 Other herpesviral infection: Secondary | ICD-10-CM | POA: Diagnosis not present

## 2016-01-19 LAB — H. PYLORI ANTIBODY, IGG: H PYLORI IGG: NEGATIVE

## 2016-01-19 LAB — CMV IGM

## 2016-01-19 MED ORDER — VALACYCLOVIR HCL 1 G PO TABS: 2000.0000 mg | ORAL_TABLET | Freq: Two times a day (BID) | ORAL | Status: DC

## 2016-01-19 MED FILL — valACYclovir HCL 1 GM TABS: 1 | 1 days supply | Qty: 4 | Fill #0

## 2016-01-19 NOTE — Patient Instructions (Signed)
Please stay hydrated and get plenty of rest. Your tonsils look great today and only once small lymph node remains -- this will resolve with time.  No need for further antibiotics.  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 Take the Valtrex as directed. Keep the fingers clean and dry. Continue Gargles (Peroxyl mouthwash is a good option).  Tylenol as needed. Avoid touching others until the lesions heal -- this can take up to 2 weeks. You can continue the topical antibiotic.  Please go to the lab to get the last of the blood work checked. We will [place] you with your results.

## 2016-01-19 NOTE — Progress Notes (Signed)
Pre visit review using our clinic review tool, if applicable. No additional management support is needed unless otherwise documented below in the visit note/SLS  

## 2016-01-19 NOTE — Progress Notes (Signed)
Patient presents to clinic today for follow-up of pharyngitis. Patient was initially thought to have strep throat and was placed on amoxicillin without improvement. Had follow-up with Dr. Charlett Blake last week where ABX were changed to [facility] and workup including negative monospot and normal CBC were obtained. CMV antibodies pending. Patient endorses since that time fever and sore throat have resolved. Denies cough or congestion. Has noticed a developing ulcer of the inner lip as well as the roof of the mouth. Notes similar lesion of third and forth fingers of left hand. States he has used that hand to pick at ulcers in the mouth. 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  Past Medical History  Diagnosis Date  . Depression   . Anxiety   . ADHD (attention deficit hyperactivity disorder)   . ETOH abuse   . Acute pharyngitis 01/15/2016    Current Outpatient Prescriptions on File Prior to Visit  Medication Sig Dispense Refill  . cefdinir (OMNICEF) 300 MG capsule Take 1 capsule (300 mg total) by mouth 2 (two) times daily. 20 capsule 0  . HYDROcodone-homatropine (HYDROMET) 5-1.5 MG/5ML syrup Take 5 mLs by mouth every 6 (six) hours as needed for cough. 120 mL 0   No current facility-administered medications on file prior to visit.    No Known Allergies  Family History  Problem Relation Age of Onset  . Healthy Mother     Living  . Healthy Father     Living  . Heart attack Maternal Grandfather   . Healthy Sister     x1  . Bone cancer Paternal Grandfather     Social History   Social History  . Marital Status: Single    Spouse Name: N/A  . Number of Children: N/A  . Years of Education: N/A   Social History Main Topics  . Smoking status: Current Every Day Smoker -- 0.00 packs/day  . Smokeless tobacco: Never Used  . Alcohol Use: No  . Drug Use: 4.00 per week    Special: Marijuana  . Sexual Activity: Yes    Birth Control/ Protection: Condom   Other Topics Concern  .  None   Social History Narrative    Review of Systems - See HPI.  All other ROS are negative.  BP 116/86 mmHg  Pulse 108  Temp(Src) 97.5 F (36.4 C) (Oral)  Resp 16  Ht 5' 8.5" (1.74 m)  Wt 167 lb 2 oz (75.807 kg)  BMI 25.04 kg/m2  SpO2 98%  Physical Exam  Constitutional: He is oriented to person, place, and time and well-developed, well-nourished, and in no distress.  HENT:  Head: Normocephalic and atraumatic.  Right Ear: External ear normal.  Left Ear: External ear normal.  Nose: Nose normal.  Mouth/Throat: Normal dentition.    Tonsils 1+ bilaterally and cryptic without swelling, erythema or exudate  Eyes: Conjunctivae are normal.  Neck: Neck supple.  Cardiovascular: Normal rate, regular rhythm, normal heart sounds and intact distal pulses.   Pulmonary/Chest: Effort normal and breath sounds normal. No respiratory distress. He has no wheezes. He has no rales. He exhibits no tenderness.  Neurological: He is alert and oriented to person, place, and time.  Skin: Skin is warm.  Psychiatric: Affect normal.  Vitals reviewed.   Recent Results (from the past 2160 hour(s))  Comprehensive metabolic panel     Status: Abnormal   Collection Time: 12/30/15  2:00 PM  Result Value Ref Range   Sodium 136 135 - 145 mmol/L  Potassium 3.5 3.5 - 5.1 mmol/L   Chloride 99 (L) 101 - 111 mmol/L   CO2 26 22 - 32 mmol/L   Glucose, Bld 90 65 - 99 mg/dL   BUN 11 6 - 20 mg/dL   Creatinine, Ser 0.95 0.61 - 1.24 mg/dL   Calcium 9.3 8.9 - 10.3 mg/dL   Total Protein 8.1 6.5 - 8.1 g/dL   Albumin 4.8 3.5 - 5.0 g/dL   AST 25 15 - 41 U/L   ALT 24 17 - 63 U/L   Alkaline Phosphatase 61 38 - 126 U/L   Total Bilirubin 0.8 0.3 - 1.2 mg/dL   GFR calc non Af Amer >60 >60 mL/min   GFR calc Af Amer >60 >60 mL/min    Comment: (NOTE) The eGFR has been calculated using the CKD EPI equation. This calculation has not been validated in all clinical situations. eGFR's persistently <60 mL/min signify possible  Chronic Kidney Disease.    Anion gap 11 5 - 15  Troponin I     Status: None   Collection Time: 12/30/15  2:00 PM  Result Value Ref Range   Troponin I <0.03 <0.031 ng/mL    Comment:        NO INDICATION OF MYOCARDIAL INJURY.   D-dimer, quantitative     Status: None   Collection Time: 12/30/15  2:00 PM  Result Value Ref Range   D-Dimer, Quant <0.27 0.00 - 0.50 ug/mL-FEU    Comment: (NOTE) At the manufacturer cut-off of 0.50 ug/mL FEU, this assay has been documented to exclude PE with a sensitivity and negative predictive value of 97 to 99%.  At this time, this assay has not been approved by the FDA to exclude DVT/VTE. Results should be correlated with clinical presentation.   CBC with Differential     Status: Abnormal   Collection Time: 12/30/15  2:00 PM  Result Value Ref Range   WBC 11.0 (H) 4.0 - 10.5 K/uL   RBC 5.32 4.22 - 5.81 MIL/uL   Hemoglobin 16.7 13.0 - 17.0 g/dL   HCT 45.3 39.0 - 52.0 %   MCV 85.2 78.0 - 100.0 fL   MCH 31.4 26.0 - 34.0 pg   MCHC 36.9 (H) 30.0 - 36.0 g/dL   RDW 11.4 (L) 11.5 - 15.5 %   Platelets 248 150 - 400 K/uL   Neutrophils Relative % 74 %   Neutro Abs 8.2 (H) 1.7 - 7.7 K/uL   Lymphocytes Relative 14 %   Lymphs Abs 1.5 0.7 - 4.0 K/uL   Monocytes Relative 12 %   Monocytes Absolute 1.4 (H) 0.1 - 1.0 K/uL   Eosinophils Relative 0 %   Eosinophils Absolute 0.0 0.0 - 0.7 K/uL   Basophils Relative 0 %   Basophils Absolute 0.0 0.0 - 0.1 K/uL  POCT rapid strep A     Status: None   Collection Time: 01/13/16  5:59 PM  Result Value Ref Range   Rapid Strep A Screen Negative Negative  Comp Met (CMET)     Status: Abnormal   Collection Time: 01/15/16  4:16 PM  Result Value Ref Range   Sodium 137 135 - 146 mmol/L   Potassium 4.0 3.8 - 5.1 mmol/L   Chloride 97 (L) 98 - 110 mmol/L   CO2 28 20 - 31 mmol/L   Glucose, Bld 85 65 - 99 mg/dL   BUN 9 7 - 20 mg/dL   Creat 0.95 0.60 - 1.26 mg/dL   Total Bilirubin 0.4 0.2 -  1.1 mg/dL   Alkaline Phosphatase  50 48 - 230 U/L   AST 24 12 - 32 U/L   ALT 15 8 - 46 U/L   Total Protein 7.1 6.3 - 8.2 g/dL   Albumin 4.2 3.6 - 5.1 g/dL   Calcium 8.8 (L) 8.9 - 10.4 mg/dL  Monospot     Status: None   Collection Time: 01/15/16  4:16 PM  Result Value Ref Range   Heterophile, Mono Screen NEGATIVE Negative  CBC with Differential/Platelet     Status: Abnormal   Collection Time: 01/15/16  4:16 PM  Result Value Ref Range   WBC 5.1 3.8 - 10.8 K/uL   RBC 4.82 4.20 - 5.80 MIL/uL   Hemoglobin 14.8 13.2 - 17.1 g/dL   HCT 44.1 38.5 - 50.0 %   MCV 91.5 80.0 - 100.0 fL   MCH 30.7 27.0 - 33.0 pg   MCHC 33.6 32.0 - 36.0 g/dL   RDW 12.8 11.0 - 15.0 %   Platelets 228 140 - 400 K/uL   MPV 9.3 7.5 - 12.5 fL   Neutro Abs 2550 1500 - 7800 cells/uL   Lymphs Abs 1785 850 - 3900 cells/uL   Monocytes Absolute 714 200 - 950 cells/uL   Eosinophils Absolute 0 (L) 15 - 500 cells/uL   Basophils Absolute 51 0 - 200 cells/uL   Neutrophils Relative % 50 %   Lymphocytes Relative 35 %   Monocytes Relative 14 %   Eosinophils Relative 0 %   Basophils Relative 1 %   Smear Review Criteria for review not met     Comment: ** Please note change in unit of measure and reference range(s). **  CMV IgM     Status: None (Preliminary result)   Collection Time: 01/15/16  4:16 PM  Result Value Ref Range   CMV IgM  <30.00 AU/mL  Sedimentation rate     Status: None   Collection Time: 01/15/16  4:16 PM  Result Value Ref Range   Sed Rate 4 0 - 15 mm/hr    Assessment/Plan: 1. Herpes stomatitis Will begin Valtrex. Gargles and Peroxyl mouthwash discussed. MTV with Zinc recommended - valACYclovir (VALTREX) 1000 MG tablet; Take 2 tablets (2,000 mg total) by mouth 2 (two) times daily.  Dispense: 4 tablet; Refill: 0  2. Herpetic whitlow Will begin Valtrex. Gargles and Peroxyl mouthwash discussed. MTV with Zinc recommended. He is to avoid touching others until lesions heal. - valACYclovir (VALTREX) 1000 MG tablet; Take 2 tablets (2,000 mg  total) by mouth 2 (two) times daily.  Dispense: 4 tablet; Refill: 0  3. Lymphadenopathy Resolving. Prior lab workup unremarkable thus far. CMV and h. Pylori pending. Will alter regimen based on results. - H. pylori antibody, IgG

## 2016-01-21 NOTE — Addendum Note (Signed)
Addended by: Marcelline MatesMARTIN, [place] on: 01/21/2016 12:02 PM   Modules accepted: Level of Service

## 2016-03-07 ENCOUNTER — Encounter: Payer: Self-pay | Admitting: Internal Medicine

## 2016-03-07 ENCOUNTER — Ambulatory Visit (INDEPENDENT_AMBULATORY_CARE_PROVIDER_SITE_OTHER): Payer: BLUE CROSS/BLUE SHIELD | Admitting: Internal Medicine

## 2016-03-07 VITALS — BP 104/70 | HR 72 | Ht 69.0 in | Wt 172.0 lb

## 2016-03-07 DIAGNOSIS — R112 Nausea with vomiting, unspecified: Secondary | ICD-10-CM | POA: Diagnosis not present

## 2016-03-07 DIAGNOSIS — R6881 Early satiety: Secondary | ICD-10-CM | POA: Diagnosis not present

## 2016-03-07 MED ORDER — OMEPRAZOLE 40 MG PO CPDR: 40.0000 mg | DELAYED_RELEASE_CAPSULE | Freq: Every day | ORAL | Status: DC

## 2016-03-07 MED FILL — OMEPRAZOLE DR 40 MG CAPSULE: 40 | 90 days supply | Qty: 90 | Fill #0

## 2016-03-07 NOTE — Progress Notes (Signed)
HISTORY OF PRESENT ILLNESS:  Joselyn Glassmanyler Kuroda is a 20 y.o. male sent today by his primary care provider Dr. Dallas Schimkeopeland with chief complaint of gagging. Patient reports a 6 month history of problems with gagging without actual vomiting on a daily basis. Most prominent in the morning. He did have an emergency room visit for chest pain. Was having problems with dysphagia which have resolved. Was on a course of omeprazole which she feels may have helped some. He smokes cigarettes and cannabis. Has a history of anxiety, depression, and ADHD. He uses alcohol. His weight fluctuates. He describes a sensation of getting full easily.  REVIEW OF SYSTEMS:  All non-GI ROS negative except for anxiety  Past Medical History  Diagnosis Date  . Depression   . Anxiety   . ADHD (attention deficit hyperactivity disorder)   . ETOH abuse   . Acute pharyngitis 01/15/2016    Past Surgical History  Procedure Laterality Date  . Wisdom tooth extraction      Social History Joselyn Glassmanyler Winberg  reports that he has been smoking.  He has never used smokeless tobacco. He reports that he uses illicit drugs (Marijuana) about 4 times per week. He reports that he does not drink alcohol.  family history includes Bone cancer in his paternal grandfather; Healthy in his father, mother, and sister; Heart attack in his maternal grandfather.  No Known Allergies     PHYSICAL EXAMINATION: Vital signs: BP 104/70 mmHg  Pulse 72  Ht 5\' 9"  (1.753 m)  Wt 172 lb (78.019 kg)  BMI 25.39 kg/m2  Constitutional: generally well-appearing, no acute distress Psychiatric: alert and oriented x3, cooperative Eyes: extraocular movements intact, anicteric, conjunctiva pink Mouth: oral pharynx moist, no lesions Neck: supple no lymphadenopathy Cardiovascular: heart regular rate and rhythm, no murmur Lungs: clear to auscultation bilaterally Abdomen: soft, nontender, nondistended, no obvious ascites, no peritoneal signs, normal bowel sounds, no  organomegaly Rectal:Omitted Extremities: no clubbing cyanosis or lower extremity edema bilaterally Skin: no lesions on visible extremities except for tattoos Neuro: No focal deficits. Cranial nerves intact  ASSESSMENT:  #1. Gagging without vomiting. Possible etiologies include GERD, gastroparesis, chronic cannabis use, and functional (anxiety). Suspect the latter #2. Early satiety without weight loss   PLAN:  #1. Prescribe omeprazole 40 mg daily empirically. This would treat acid peptic disorders. Stay on until follow-up #2. Stop cannabis use #3. Solid-phase gastric ending scan to rule out gastroparesis #4. Office follow-up 2 months. Consider upper endoscopy if symptoms persist despite compliance with above measures  A copy of this dictation has been sent to Dr. Dallas Schimkeopeland

## 2016-03-07 NOTE — Patient Instructions (Addendum)
We have sent the following medications to your pharmacy for you to pick up at your convenience:  Omeprazole  You have been [facility]2016 at 7:30am. Please arrive at least 15 minutes prior to your appointment for registration. Please make certain not to have anything to eat or drink after midnight the night before your test. Hold all stomach medications (ex: Zofran, phenergan, Reglan) 8 hours prior to your test. If you need to reschedule your appointment, please contact radiology scheduling at [phone]. _____________________________________________________________________ A gastric-emptying study measures how long it takes for food to move through your stomach. There are several ways to measure stomach emptying. In the most common test, you eat food that contains a small amount of radioactive material. A scanner that detects the movement of the radioactive material is placed over your abdomen to monitor the rate at which food leaves your stomach. This test normally takes about 2 hours to complete. _____________________________________________________________________   Please follow up with Dr. Marina GoodellPerry on 05/12/2016 at 8:30 am

## 2016-03-18 ENCOUNTER — Ambulatory Visit (HOSPITAL_COMMUNITY)
Admission: RE | Admit: 2016-03-18 | Discharge: 2016-03-18 | Disposition: A | Discharge status: Home / Self Care | Payer: BLUE CROSS/BLUE SHIELD | Source: Ambulatory Visit | Attending: Internal Medicine | Admitting: Internal Medicine

## 2016-03-18 DIAGNOSIS — R112 Nausea with vomiting, unspecified: Secondary | ICD-10-CM

## 2016-03-18 MED ORDER — TECHNETIUM TC 99M SULFUR COLLOID: 1.9000 | Freq: Once | INTRAVENOUS | Status: DC | PRN

## 2016-04-25 ENCOUNTER — Encounter: Payer: Self-pay | Admitting: Physician Assistant

## 2016-04-25 ENCOUNTER — Ambulatory Visit (INDEPENDENT_AMBULATORY_CARE_PROVIDER_SITE_OTHER): Payer: BLUE CROSS/BLUE SHIELD | Admitting: Physician Assistant

## 2016-04-25 VITALS — BP 118/88 | HR 95 | Temp 98.2°F | Resp 16 | Ht 69.0 in | Wt 178.4 lb

## 2016-04-25 DIAGNOSIS — R369 Urethral discharge, unspecified: Secondary | ICD-10-CM

## 2016-04-25 DIAGNOSIS — N342 Other urethritis: Secondary | ICD-10-CM | POA: Diagnosis not present

## 2016-04-25 LAB — POCT URINALYSIS DIPSTICK
BILIRUBIN UA: NEGATIVE
GLUCOSE UA: NEGATIVE
Ketones, UA: NEGATIVE
Nitrite, UA: NEGATIVE
PH UA: 6
Protein, UA: NEGATIVE
RBC UA: NEGATIVE
SPEC GRAV UA: 1.025
Urobilinogen, UA: 0.2

## 2016-04-25 MED ORDER — AZITHROMYCIN 250 MG PO TABS
1000.0000 mg | ORAL_TABLET | Freq: Every day | ORAL | Status: DC
  Administered 2016-04-25: 1000 mg via ORAL

## 2016-04-25 MED ORDER — AZITHROMYCIN 500 MG PO TABS: 1000.0000 mg | ORAL_TABLET | Freq: Once | ORAL | 0 refills | Status: DC

## 2016-04-25 MED ORDER — CEFTRIAXONE SODIUM 250 MG IJ SOLR
250.0000 mg | Freq: Once | INTRAMUSCULAR | Status: AC
  Administered 2016-04-25: 250 mg via INTRAMUSCULAR

## 2016-04-25 NOTE — Progress Notes (Signed)
Patient presents to clinic today c/o 2-3 days of urinary urgency, frequency and dysuria, with 1-1/2 days of white/yellow penile discharge patient endorses some urethral pain and tenderness along with pain at the tip of his penis patient is currently sexually active with one new male partner. Denies consistent condom use. Endorses his partner was checked for STDs 2 weeks ago and given a clean bill of health. Denies sexual interaction with another partner.  Denies fever, chills, malaise or fatigue.      Past Medical History:  Diagnosis Date  . Acute pharyngitis 01/15/2016  . ADHD (attention deficit hyperactivity disorder)   . Anxiety   . Depression   . ETOH abuse     Current Outpatient Prescriptions on File Prior to Visit  Medication Sig Dispense Refill  . omeprazole (PRILOSEC) 40 MG capsule Take 1 capsule (40 mg total) by mouth daily. 90 capsule 3   No current facility-administered medications on file prior to visit.     No Known Allergies  Family History  Problem Relation Age of Onset  . Healthy Mother     Living  . Healthy Father     Living  . Heart attack Maternal Grandfather   . Healthy Sister     x1  . Bone cancer Paternal Grandfather     Social History   Social History  . Marital status: Single    Spouse name: N/A  . Number of children: N/A  . Years of education: N/A   Social History Main Topics  . Smoking status: Current Every Day Smoker    Packs/day: 0.25  . Smokeless tobacco: Never Used  . Alcohol use No  . Drug use:     Frequency: 4.0 times per week    Types: Marijuana  . Sexual activity: Yes    Birth control/ protection: Condom   Other Topics Concern  . None   Social History Narrative  . None   Review of Systems - See HPI.  All other ROS are negative.  BP 118/88 (BP Location: Left Arm, Patient Position: Sitting, Cuff Size: Normal)   Pulse 95   Temp 98.2 F (36.8 C) (Oral)   Resp 16   Ht 5\' 9"  (1.753 m)   Wt 178 lb 6 oz (80.9 kg)   SpO2  98%   BMI 26.34 kg/m   Physical Exam  Constitutional: He is oriented to person, place, and time and well-developed, well-nourished, and in no distress.  HENT:  Head: Normocephalic and atraumatic.  Eyes: Conjunctivae are normal.  Cardiovascular: Normal rate, regular rhythm, normal heart sounds and intact distal pulses.   Pulmonary/Chest: Breath sounds normal. No respiratory distress. He has no wheezes. He has no rales.  Genitourinary: Testes/scrotum normal. Penis exhibits no lesions and no edema. Yellow and discharge found.  Genitourinary Comments: Inflammation of the tip of corona of glans noted with vesicle or ulceration.  Neurological: He is alert and oriented to person, place, and time.  Skin: Skin is warm and dry. No rash noted.  Psychiatric: Affect normal.  Vitals reviewed.  Recent Results (from the past 2160 hour(s))  POCT urinalysis dipstick     Status: Abnormal   Collection Time: 04/25/16  4:38 PM  Result Value Ref Range   Color, UA yellow    Clarity, UA cloudy     Comment: slight   Glucose, UA neg    Bilirubin, UA neg    Ketones, UA neg    Spec Grav, UA 1.025    Blood, UA  neg    pH, UA 6.0    Protein, UA neg    Urobilinogen, UA 0.2    Nitrite, UA neg    Leukocytes, UA small (1+) (A) Negative   Assessment/Plan: Urethritis Urine dip with trace leukocytes. No other sign of bladder infection. We'll send for culture. There is penile pain on examination along with white/yellow penile discharge. Discharge was obtained to send for further testing. We'll empirically treat for STI urethritis. IM Rocephin 250 mg given for gonorrhea. 1 g azithromycin given for potential chlamydia in office. Supportive measures and safe sex practices discussed with patient. We'll alter regimen based on his results. If testing is positive for STI, we will alert the health department. Discussed with him the need for for STI panel today. Patient declines. Highly encourage him to return for full panel  is testing today is positive.    Piedad ClimesMartin, Todd Lasseter Cody, PA-C

## 2016-04-25 NOTE — Assessment & Plan Note (Signed)
Urine dip with trace leukocytes. No other sign of bladder infection. We'll send for culture. There is penile pain on examination along with white/yellow penile discharge. Discharge was obtained to send for further testing. We'll empirically treat for STI urethritis. IM Rocephin 250 mg given for gonorrhea. 1 g azithromycin given for potential chlamydia in office. Supportive measures and safe sex practices discussed with patient. We'll alter regimen based on his results. If testing is positive for STI, we will alert the health department. Discussed with him the need for for STI panel today. Patient declines. Highly encourage him to return for full panel is testing today is positive.

## 2016-04-25 NOTE — Patient Instructions (Signed)
You have been empirically treated for gonorrhea and chlamydia urethritis. We are obtaining testing today to further confirm this. We are also sending urine for culture to check for a bladder infection as well.   We will alter her regimen based on results. If any of these are positive for STD, I would highly encourage her let us check a full STD panel.  Please avoid from any type of sexual activity until symptoms are resolved and workup is completed.  Always make sure to know sexual history of your sexual partners. Be consistent with condom use.

## 2016-04-28 LAB — CULTURE, URINE COMPREHENSIVE: Organism ID, Bacteria: NO GROWTH

## 2016-04-29 LAB — GC/CHLAMYDIA PROBE AMP

## 2016-05-02 ENCOUNTER — Telehealth: Payer: Self-pay | Admitting: Physician Assistant

## 2016-05-02 ENCOUNTER — Telehealth: Payer: Self-pay | Admitting: Behavioral Health

## 2016-05-02 NOTE — Telephone Encounter (Signed)
Spoke with patient concerning positive gonorrhea testing. Patient was empirically treated in office of date of visit (04/25/16) for both gonorrhea and chlamydia. Patient endorses feeling well. Denies any residual symptoms.  [facility]. Was faxed to them.

## 2016-05-02 NOTE — Telephone Encounter (Signed)
Spoke with patient. Was already treated for both Gonorrhea and Chlamydia at office visit. No residual symptoms. Case was [facility].

## 2016-05-02 NOTE — Telephone Encounter (Signed)
Caller: Victorino DikeJennifer at [facility], [phone]  Reason for Marxen: Results  She reported critical lab for genital culture, isolated Neisseria Gonorrhea. Results are being faxed to the office as well. Message routed to PCP.

## 2016-05-05 LAB — CULTURE, ROUTINE-GENITAL

## 2016-05-12 ENCOUNTER — Ambulatory Visit: Payer: Self-pay | Admitting: Internal Medicine

## 2016-08-17 ENCOUNTER — Ambulatory Visit (INDEPENDENT_AMBULATORY_CARE_PROVIDER_SITE_OTHER): Payer: BLUE CROSS/BLUE SHIELD | Admitting: Family Medicine

## 2016-08-17 ENCOUNTER — Encounter: Payer: Self-pay | Admitting: Family Medicine

## 2016-08-17 VITALS — BP 110/70 | HR 89 | Temp 97.5°F | Ht 69.0 in | Wt 182.0 lb

## 2016-08-17 DIAGNOSIS — F411 Generalized anxiety disorder: Secondary | ICD-10-CM

## 2016-08-17 DIAGNOSIS — F322 Major depressive disorder, single episode, severe without psychotic features: Secondary | ICD-10-CM

## 2016-08-17 MED ORDER — PAROXETINE HCL 20 MG PO TABS: 20.0000 mg | ORAL_TABLET | Freq: Every day | ORAL | 3 refills | Status: DC

## 2016-08-17 NOTE — Patient Instructions (Signed)
I am going to have your try paxil 20 mg for depression and anxiety. Please do contact your psychiatrist and schedule a follow-up appointment!  Please see either myself of your psychiatrist in about 3 weeks for a recheck. If you are getting worse, having suicidal thoughts or other concerns please seek help right away  Other things that can help include time outdoors, meditation, exercise, healthy food

## 2016-08-17 NOTE — Progress Notes (Signed)
[facility] [address], [address] [place], [place] [address] [phone] [phone]  Date:  08/17/2016   Name:  Todd Vaughn   DOB:  Jan 25, 1996   MRN:  [id number]  PCP:  Piedad ClimesMartin, William Cody, PA-C    Chief Complaint: Medication Follow Up (Anxiety and Depression. Wants to discuss the possibility or no of getting on medications)   History of Present Illness:  Todd Glassmanyler Gazzola is a 20 y.o. very pleasant male patient who presents with the following:  I had referred this pt to psychiatry in April for concern of anxiety and depression - partial HPI from that visit  He does have a history of anxiety and depression.   He has discussed this with his pediatrician and with Selena BattenCody in the past. He has done some therapy but is not in therapy currently. He has been on prozac, zoloft, ?buspar, wellbutrin in the past. He has had mood issues since a pretty young age- [age] or [age].  [facility] had [facility] couple of years ago- he took an overdose but states that he was not really trying to hurt himself.  "it was my way of asking for help"  He has not seen a psychiatrist ever but they would like to do this They do not have guns at home.  He denies active SI  Right now he is not in school or working.  He graduated high school last year. Hopes to begin working again soon. 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   There is a history of depression on his dad's side of the family.    He has a good support system- he indicates his mother, father and his GM as people he can count on.  Here today with concern of anxiety and depression.   He notes that he is having panic attacks and difficulty sleeping He did see the psychiatrist that I referred him to earlier this year and was treated with buspar and zoloft.   He only saw them once, did not follow-up.   He is not sure if he did not want to follow-up or had an insurance concern He is not  taking any medication now He notes that he is biting his nails, he is not sleeping much. He feels tired, but he can't sleep.   He is not having any sx mania- he does not feel euphoric or like he does not need sleep.   No SI.   He denies any alcohol use or drug use.  He is not exercising formally very much but is active at his job  Right now he is working full time at [employer].   He has tried therapy in the past but did not continue to use it- only went for 2 months.       Patient Active Problem List   Diagnosis Date Noted  . Urethritis 04/25/2016  . Lymphadenopathy 01/15/2016  . Acute pharyngitis 01/15/2016  . Anxiety and depression 12/02/2014  . ADHD (attention deficit hyperactivity disorder), combined type 06/14/2014  . Cannabis use disorder, moderate, dependence (HCC) 06/14/2014    Past Medical History:  Diagnosis Date  . Acute pharyngitis 01/15/2016  . ADHD (attention deficit hyperactivity disorder)   . Anxiety   . Depression   . ETOH abuse     Past Surgical History:  Procedure Laterality Date  . WISDOM TOOTH EXTRACTION      Social  History  Substance Use Topics  . Smoking status: Current Every Day Smoker    Packs/day: 0.25  . Smokeless tobacco: Never Used  . Alcohol use No    Family History  Problem Relation Age of Onset  . Healthy Mother     Living  . Healthy Father     Living  . Heart attack Maternal Grandfather   . Healthy Sister     x1  . Bone cancer Paternal Grandfather     No Known Allergies  Medication list has been reviewed and updated.  No current outpatient prescriptions on file prior to visit.   No current facility-administered medications on file prior to visit.     Review of Systems:  As per HPI- otherwise negative.   Physical Examination: Vitals:   08/17/16 1516  BP: (!) 144/70  Pulse: 89  Temp: 97.5 F (36.4 C)   Vitals:   08/17/16 1516  Weight: 182 lb (82.6 kg)  Height: 5\' 9"  (1.753 m)   Body mass index is 26.88  kg/m. Ideal Body Weight: Weight in (lb) to have BMI = 25: 168.9  GEN: WDWN, NAD, Non-toxic, A & O x 3, appears physically healthy but flat affect HEENT: Atraumatic, Normocephalic. Neck supple. No masses, No LAD. Ears and Nose: No external deformity. CV: RRR, No M/G/R. No JVD. No thrill. No extra heart sounds. PULM: CTA B, no wheezes, crackles, rhonchi. No retractions. No resp. distress. No accessory muscle use. EXTR: No c/c/e NEURO Normal gait.  PSYCH: Normally interactive. Conversant.    Assessment and Plan: MDD (major depressive disorder), severe (HCC) - Plan: PARoxetine (PAXIL) 20 MG tablet  Anxiety state - Plan: PARoxetine (PAXIL) 20 MG tablet  Here today to discuss persistent anxiety and depression.  This is a higher risk pt due to his age and long- standing issues with depression.  I had therefore referred him to psychiatry.  He is here to see me with recurrent sx - I will start him on paxil as his sx are now more anxiety than depression.  However I explained to him that I do want the expertise of a psychiatrist and he needs to follow-up.  He agrees to contact them for a follow-up visit, but will see me in 2-3 weeks for follow-up first if it takes awhile to get an appt Advised him to seek care right away if any worsening or suicidal thoughts and he agres   Signed Abbe AmsterdamJessica Copland, MD

## 2016-08-17 NOTE — Progress Notes (Signed)
Pre visit review using our clinic review tool, if applicable. No additional management support is needed unless otherwise documented below in the visit note. 

## 2017-03-22 ENCOUNTER — Emergency Department (HOSPITAL_COMMUNITY): Payer: Self-pay

## 2017-03-22 ENCOUNTER — Encounter (HOSPITAL_COMMUNITY): Payer: Self-pay | Admitting: Emergency Medicine

## 2017-03-22 ENCOUNTER — Emergency Department (HOSPITAL_COMMUNITY)
Admission: EM | Admit: 2017-03-22 | Discharge: 2017-03-23 | Disposition: A | Discharge status: Home / Self Care | Payer: Self-pay | Attending: Emergency Medicine | Admitting: Emergency Medicine

## 2017-03-22 DIAGNOSIS — R1013 Epigastric pain: Secondary | ICD-10-CM | POA: Insufficient documentation

## 2017-03-22 DIAGNOSIS — F17200 Nicotine dependence, unspecified, uncomplicated: Secondary | ICD-10-CM | POA: Insufficient documentation

## 2017-03-22 DIAGNOSIS — R110 Nausea: Secondary | ICD-10-CM | POA: Insufficient documentation

## 2017-03-22 LAB — COMPREHENSIVE METABOLIC PANEL
ALK PHOS: 60 U/L (ref 38–126)
ALT: 20 U/L (ref 17–63)
ANION GAP: 13 (ref 5–15)
AST: 27 U/L (ref 15–41)
Albumin: 4.8 g/dL (ref 3.5–5.0)
BILIRUBIN TOTAL: 1.1 mg/dL (ref 0.3–1.2)
BUN: 21 mg/dL — ABNORMAL HIGH (ref 6–20)
CALCIUM: 9.4 mg/dL (ref 8.9–10.3)
CO2: 21 mmol/L — ABNORMAL LOW (ref 22–32)
CREATININE: 1.08 mg/dL (ref 0.61–1.24)
Chloride: 103 mmol/L (ref 101–111)
Glucose, Bld: 77 mg/dL (ref 65–99)
Potassium: 3.9 mmol/L (ref 3.5–5.1)
SODIUM: 137 mmol/L (ref 135–145)
TOTAL PROTEIN: 8.3 g/dL — AB (ref 6.5–8.1)

## 2017-03-22 LAB — CBC
HCT: 44.8 % (ref 39.0–52.0)
HEMOGLOBIN: 16.7 g/dL (ref 13.0–17.0)
MCH: 32.1 pg (ref 26.0–34.0)
MCHC: 37.3 g/dL — ABNORMAL HIGH (ref 30.0–36.0)
MCV: 86.2 fL (ref 78.0–100.0)
PLATELETS: 239 10*3/uL (ref 150–400)
RBC: 5.2 MIL/uL (ref 4.22–5.81)
RDW: 11.9 % (ref 11.5–15.5)
WBC: 6.7 10*3/uL (ref 4.0–10.5)

## 2017-03-22 LAB — URINALYSIS, ROUTINE W REFLEX MICROSCOPIC
Bacteria, UA: NONE SEEN
Bilirubin Urine: NEGATIVE
GLUCOSE, UA: NEGATIVE mg/dL
HGB URINE DIPSTICK: NEGATIVE
KETONES UR: 80 mg/dL — AB
Leukocytes, UA: NEGATIVE
NITRITE: NEGATIVE
PH: 5 (ref 5.0–8.0)
PROTEIN: NEGATIVE mg/dL
Specific Gravity, Urine: 1.032 — ABNORMAL HIGH (ref 1.005–1.030)
Squamous Epithelial / LPF: NONE SEEN

## 2017-03-22 LAB — I-STAT CG4 LACTIC ACID, ED: LACTIC ACID, VENOUS: 1.22 mmol/L (ref 0.5–1.9)

## 2017-03-22 LAB — LIPASE, BLOOD: Lipase: 21 U/L (ref 11–51)

## 2017-03-22 MED ORDER — ONDANSETRON 4 MG PO TBDP
4.0000 mg | ORAL_TABLET | Freq: Once | ORAL | Status: AC
  Administered 2017-03-22: 4 mg via ORAL
  Filled 2017-03-22: qty 1

## 2017-03-22 MED ORDER — HYDROCODONE-ACETAMINOPHEN 5-325 MG PO TABS
1.0000 | ORAL_TABLET | Freq: Once | ORAL | Status: AC
  Administered 2017-03-22: 1 via ORAL
  Filled 2017-03-22: qty 1

## 2017-03-22 MED ORDER — KETOROLAC TROMETHAMINE 15 MG/ML IJ SOLN: 15.0000 mg | Freq: Once | INTRAMUSCULAR | Status: DC

## 2017-03-22 MED ORDER — ONDANSETRON 4 MG PO TBDP
4.0000 mg | ORAL_TABLET | Freq: Once | ORAL | Status: AC | PRN
  Administered 2017-03-22: 4 mg via ORAL
  Filled 2017-03-22: qty 1

## 2017-03-22 NOTE — ED Triage Notes (Signed)
Pt comes in with complaints of left lower rib/left upper abdominal pain that started today.  Pt dry heaving during triage.  Pt states he took some chewable pepto's without relief.  Pt states he has been "dry heaving for 2 years" and has been to several doctors for it.  Pt states he has been unable to eat and drink like normally.  A&O x4.  Ambulatory. Denies diarrhea.

## 2017-03-22 NOTE — ED Provider Notes (Signed)
[facility]-EMERGENCY DEPT Provider Note   CSN: [id number] Arrival date & time: 03/22/17  1938  By signing my name below, I, Todd Vaughn, attest that this documentation has been prepared under the direction and in the presence of Derwood KaplanNanavati, Morena Mckissack, MD. Electronically Signed: Cynda AcresHailei Vaughn, Scribe. 03/22/17. 11:19 PM.  History   Chief Complaint Chief Complaint  Patient presents with  . Abdominal Pain    HPI Comments: Todd Vaughn is a 21 y.o. male with no pertinent past medical history, who presents to the Emergency Department complaining of sudden-onset, intermittent abdominal pain that began earlier today. Patient reports gradually worsening intermittent episodes of epigastric abdominal pain, which radiates throughout the entire abdomen. Patient states he experiences these pressure-like severe 10/10 episodes every 1-2 minutes. Patient reports associated nausea. Patient reports taking chewable Pepto bismol with no relief. Nothing improves or worsens his pain. Patient reports mild marijuana use. Patient denies any IV drug use, alcohol use, or history of acid reflux. Patient denies any fever, chills, back pain, shortness of breath, chest pain, vomiting, diarrhea, or any additional symptoms.   The history is provided by the patient. No language interpreter was used.    Past Medical History:  Diagnosis Date  . Acute pharyngitis 01/15/2016  . ADHD (attention deficit hyperactivity disorder)   . Anxiety   . Depression   . ETOH abuse     Patient Active Problem List   Diagnosis Date Noted  . Urethritis 04/25/2016  . Lymphadenopathy 01/15/2016  . Acute pharyngitis 01/15/2016  . Anxiety and depression 12/02/2014  . ADHD (attention deficit hyperactivity disorder), combined type 06/14/2014  . Cannabis use disorder, moderate, dependence (HCC) 06/14/2014    Past Surgical History:  Procedure Laterality Date  . WISDOM TOOTH EXTRACTION         Home Medications    Prior to Admission medications     Medication Sig Start Date End Date Taking? Authorizing Provider  bismuth subsalicylate (PEPTO BISMOL) 262 MG chewable tablet Chew 524 mg by mouth daily as needed for indigestion or diarrhea or loose stools.   Yes [provider]  clonazePAM (KLONOPIN) 1 MG tablet Take 1 mg by mouth 2 (two) times daily as needed for anxiety.   Yes [provider]  naproxen (NAPROSYN) 500 MG tablet Take 1 tablet (500 mg total) by mouth 2 (two) times daily with a meal. 03/23/17   Derwood KaplanNanavati, Artavis Cowie, MD  omeprazole (PRILOSEC) 20 MG capsule Take 1 capsule (20 mg total) by mouth daily. 03/23/17   Derwood KaplanNanavati, Milianna Ericsson, MD  ondansetron (ZOFRAN ODT) 4 MG disintegrating tablet Take 1 tablet (4 mg total) by mouth every 8 (eight) hours as needed for nausea or vomiting. 03/23/17   Derwood KaplanNanavati, Rollo Farquhar, MD  PARoxetine (PAXIL) 20 MG tablet Take 1 tablet (20 mg total) by mouth daily. Patient not taking: Reported on 03/22/2017 08/17/16   Copland, Gwenlyn FoundJessica C, MD    Family History Family History  Problem Relation Age of Onset  . Healthy Mother        Living  . Healthy Father        Living  . Heart attack Maternal Grandfather   . Healthy Sister        x1  . Bone cancer Paternal Grandfather     Social History Social History  Substance Use Topics  . Smoking status: Current Every Day Smoker    Packs/day: 0.25  . Smokeless tobacco: Never Used  . Alcohol use Yes     Comment: socially     Allergies  Patient has no known allergies.   Review of Systems Review of Systems  Constitutional: Negative for chills and fever.  Respiratory: Negative for shortness of breath.   Cardiovascular: Negative for chest pain.  Gastrointestinal: Positive for abdominal pain and nausea. Negative for diarrhea and vomiting.  Musculoskeletal: Negative for back pain.  All other systems reviewed and are negative.    Physical Exam Updated Vital Signs BP 120/78 (BP Location: Left Arm)   Pulse 83   Temp 98.1 F (36.7 C) (Oral)   Resp  16   Ht 5\' 11"  (1.803 m)   Wt 79.4 kg (175 lb)   SpO2 98%   BMI 24.41 kg/m   Physical Exam  Constitutional: He is oriented to person, place, and time. He appears well-developed.  HENT:  Head: Normocephalic and atraumatic.  Mouth/Throat: Oropharynx is clear and moist.  Eyes: Conjunctivae and EOM are normal. Pupils are equal, round, and reactive to light.  Neck: Normal range of motion. Neck supple.  Cardiovascular: Normal rate and regular rhythm.   2+ and equal radial pulse bilaterally.  Pulmonary/Chest: Effort normal and breath sounds normal. He has no wheezes. He has no rales.  Abdominal: Soft. Bowel sounds are normal. There is tenderness. There is no rebound and no guarding.  Tenderness over the upper quadrants specifically over the epigastrium. Abdomen is soft.   Musculoskeletal: Normal range of motion.  Neurological: He is alert and oriented to person, place, and time.  Skin: Skin is warm and dry.  Nursing note and vitals reviewed.    ED Treatments / Results  DIAGNOSTIC STUDIES: Oxygen Saturation is 99% on RA, normal by my interpretation.    COORDINATION OF CARE: 11:18 PM Discussed treatment plan with pt at bedside and pt agreed to plan, which includes pain medication.   Labs (all labs ordered are listed, but only abnormal results are displayed) Labs Reviewed  COMPREHENSIVE METABOLIC PANEL - Abnormal; Notable for the following:       Result Value   CO2 21 (*)    BUN 21 (*)    Total Protein 8.3 (*)    All other components within normal limits  CBC - Abnormal; Notable for the following:    MCHC 37.3 (*)    All other components within normal limits  URINALYSIS, ROUTINE W REFLEX MICROSCOPIC - Abnormal; Notable for the following:    Specific Gravity, Urine 1.032 (*)    Ketones, ur 80 (*)    All other components within normal limits  LIPASE, BLOOD  I-STAT TROPOININ, ED  I-STAT CG4 LACTIC ACID, ED    EKG  EKG Interpretation None       Radiology US Abdomen  Complete  Result Date: 03/23/2017 CLINICAL DATA:  Chest pain EXAM: ABDOMEN ULTRASOUND COMPLETE COMPARISON:  03/22/2017 FINDINGS: Gallbladder: No gallstones or wall thickening visualized. No sonographic Murphy sign noted by sonographer. Common bile duct: Diameter: 2.3 mm Liver: No focal lesion identified. Within normal limits in parenchymal echogenicity. IVC: No abnormality visualized. Pancreas: Visualized portion unremarkable. Spleen: Size and appearance within normal limits. Right Kidney: Length: 9.1 cm. Echogenicity within normal limits. No mass or hydronephrosis visualized. Left Kidney: Length: 9.9 cm. Echogenicity within normal limits. No mass or hydronephrosis visualized. Abdominal aorta: No aneurysm visualized. Other findings: None. IMPRESSION: Negative abdominal ultrasound Electronically Signed   By: Jasmine Pang M.D.   On: 03/23/2017 00:19   Dg Abdomen Acute W/chest  Result Date: 03/23/2017 CLINICAL DATA:  21 year old male with chest and abdominal pain. EXAM: DG ABDOMEN ACUTE W/  1V CHEST COMPARISON:  Chest radiograph dated 12/30/2015 FINDINGS: The lungs are clear. There is no pleural effusion or pneumothorax. The cardiac silhouette is within normal limits. No acute osseous pathology. There is no bowel dilatation or evidence of obstruction. No free air or radiopaque calculi. The soft tissues and osseous structures appear unremarkable. IMPRESSION: Negative abdominal radiographs.  No acute cardiopulmonary disease. Electronically Signed   By: Elgie Collard M.D.   On: 03/23/2017 00:00    Procedures Procedures (including critical care time)  Medications Ordered in ED Medications  ondansetron (ZOFRAN-ODT) disintegrating tablet 4 mg (4 mg Oral Given 03/22/17 2022)  HYDROcodone-acetaminophen (NORCO/VICODIN) 5-325 MG per tablet 1 tablet (1 tablet Oral Given 03/22/17 2327)  ondansetron (ZOFRAN-ODT) disintegrating tablet 4 mg (4 mg Oral Given 03/22/17 2327)     Initial Impression / Assessment and  Plan / ED Course  I have reviewed the triage vital signs and the nursing notes.  Pertinent labs & imaging results that were available during my care of the patient were reviewed by me and considered in my medical decision making (see chart for details).    Pt comes in with cc of abd pain.  DDx includes: Pancreatitis Hepatobiliary pathology including cholecystitis Gastritis/PUD SBO ACS syndrome Aortic Dissection  Pt's abd pain is mostly epigastric, and substernal. He also had tenderness on the L side. Pt's pain is not radiating to the back. He denies alcohol abuse, or drug abuse.  We will get basic labs and [place] abd. Lipase is normal. We will give norco for pain now. PO challenge initiated.  Based on exam, doubt acute surgical pathology. The labs and vitals are reassuring as well.  Final Clinical Impressions(s) / ED Diagnoses   Final diagnoses:  Epigastric abdominal pain    New Prescriptions Discharge Medication List as of 03/23/2017 12:34 AM    START taking these medications   Details  naproxen (NAPROSYN) 500 MG tablet Take 1 tablet (500 mg total) by mouth 2 (two) times daily with a meal., Starting Thu 03/23/2017, Print    omeprazole (PRILOSEC) 20 MG capsule Take 1 capsule (20 mg total) by mouth daily., Starting Thu 03/23/2017, Print    ondansetron (ZOFRAN ODT) 4 MG disintegrating tablet Take 1 tablet (4 mg total) by mouth every 8 (eight) hours as needed for nausea or vomiting., Starting Thu 03/23/2017, Print       I personally performed the services described in this documentation, which was scribed in my presence. The recorded information has been reviewed and is accurate.    Derwood Kaplan, MD 03/23/17 [phone]

## 2017-03-23 LAB — I-STAT TROPONIN, ED: Troponin i, poc: 0.01 ng/mL (ref 0.00–0.08)

## 2017-03-23 MED ORDER — NAPROXEN 500 MG PO TABS: 500.0000 mg | ORAL_TABLET | Freq: Two times a day (BID) | ORAL | 0 refills | Status: DC

## 2017-03-23 MED ORDER — OMEPRAZOLE 20 MG PO CPDR: 20.0000 mg | DELAYED_RELEASE_CAPSULE | Freq: Every day | ORAL | 0 refills | Status: DC

## 2017-03-23 MED ORDER — ONDANSETRON 4 MG PO TBDP: 4.0000 mg | ORAL_TABLET | Freq: Three times a day (TID) | ORAL | 0 refills | Status: DC | PRN

## 2017-03-23 NOTE — Discharge Instructions (Signed)
°  All the results in the ER are normal, labs and imaging. We are not sure what is causing your symptoms. The workup in the ER is not complete, and is limited to screening for life threatening and emergent conditions only, so please see a primary care doctor for further evaluation.  WE RECOMMEND CLEAR LIQUID DIET FOR THE NEXT 3 DAYS BEFORE ADVANCING TO NORMAL DIET.

## 2017-03-23 NOTE — ED Notes (Signed)
Pt tolerated fluids well orally.

## 2017-05-31 ENCOUNTER — Encounter: Payer: Self-pay | Admitting: Medical

## 2017-05-31 ENCOUNTER — Telehealth: Payer: Self-pay | Admitting: Medical

## 2017-05-31 ENCOUNTER — Ambulatory Visit (INDEPENDENT_AMBULATORY_CARE_PROVIDER_SITE_OTHER): Payer: 59 | Admitting: Medical

## 2017-05-31 VITALS — BP 129/70 | HR 104 | Temp 98.4°F | Ht 69.0 in | Wt 166.4 lb

## 2017-05-31 DIAGNOSIS — S39011A Strain of muscle, fascia and tendon of abdomen, initial encounter: Secondary | ICD-10-CM

## 2017-05-31 DIAGNOSIS — R05 Cough: Secondary | ICD-10-CM

## 2017-05-31 DIAGNOSIS — R059 Cough, unspecified: Secondary | ICD-10-CM

## 2017-05-31 DIAGNOSIS — J40 Bronchitis, not specified as acute or chronic: Secondary | ICD-10-CM | POA: Diagnosis not present

## 2017-05-31 DIAGNOSIS — J029 Acute pharyngitis, unspecified: Secondary | ICD-10-CM | POA: Diagnosis not present

## 2017-05-31 DIAGNOSIS — F419 Anxiety disorder, unspecified: Secondary | ICD-10-CM | POA: Diagnosis not present

## 2017-05-31 MED ORDER — FLUTICASONE PROPIONATE 50 MCG/ACT NA SUSP: 2.0000 | Freq: Every day | NASAL | 2 refills | Status: DC

## 2017-05-31 MED ORDER — AMOXICILLIN-POT CLAVULANATE 875-125 MG PO TABS: 1.0000 | ORAL_TABLET | Freq: Two times a day (BID) | ORAL | 0 refills | Status: DC

## 2017-05-31 MED ORDER — HYDROCODONE-HOMATROPINE 5-1.5 MG/5ML PO SYRP: 5.0000 mL | ORAL_SOLUTION | Freq: Four times a day (QID) | ORAL | 0 refills | Status: DC | PRN

## 2017-05-31 MED ORDER — CLONAZEPAM 1 MG PO TABS: 1.0000 mg | ORAL_TABLET | Freq: Two times a day (BID) | ORAL | 0 refills | Status: DC | PRN

## 2017-05-31 MED FILL — AMOX-CLAV 875-125 MG TABLET: 875-125 | 10 days supply | Qty: 20 | Fill #0

## 2017-05-31 MED FILL — FLUTICASONE PROP 50 MCG SPR: 50 | 30 days supply | Qty: 16 | Fill #0

## 2017-05-31 MED FILL — HYDROCODONE-HOMATROPINE SYR: 5-1.5 | 5 days supply | Qty: 120 | Fill #0

## 2017-05-31 NOTE — Patient Instructions (Addendum)
You appear to have bronchitis with some allergy component. Rest hydrate and tylenol for fever. I am prescribing cough medicine hycodan, and augmentin antibiotic. For your nasal congestion flonase.  For pharyngitis/possible strep. Augmentin should have adequate coverage. I decided not to do a rapid strep test since treating for bronchitis with Augmentin.  You should gradually get better. If not then notify us and would recommend a chest xray.  Your abdomen pain seems to be associated with the cough/onset of recent illness. On exam pain seems to be over rectus abdominal muscle. If your abdomen pain lingers, changes or worsening signs/ symptoms then would start abdomen pain workup. So please keep [place] updated on level of pain and if it subsides or worsen.  For your history of anxiety and depression. I would encourage you to get back in with a psychiatrist. I will send message to Dr. Patsy Lager to see if she would prescribe you clonazepam. If you don't get an answer from our office by Friday morning then recommend you all back.  Follow up in 7-10 days or as needed

## 2017-05-31 NOTE — Telephone Encounter (Signed)
Dr. Patsy Lager,  I saw Todd Vaughn today. He is a patient of years but has not seen you in some time. He was requesting clonazepam but I saw your last no and it appears you referred to psychiatry. Patient has not gone to that appointment. I advised him that after reviewing your note I would not prescribe his clonazepam but would pass the request to you. I explained to him that psychiatrists may prescribe clonazepam. But I did not feel comfortable writing that for him.   Thanks ,  Ramon Dredge

## 2017-05-31 NOTE — Progress Notes (Signed)
Subjective:    Patient ID: Todd Vaughn, male    DOB: 1996-07-09, 20 y.o.   MRN: [id number]  HPI   Pt sick for one week. Mild st. Chest congested as well. Some sinus pain minimal. No fever, no chills or sweats. Cough is severe. Interrupted his sleep. Patient does smoke and I encouraged him to stop today.  Some sneezing recently. He does admit some history of allergies and fall.  Recent mild to moderate sore throat for about one week as well. Not as bad a sore throat as when he had positive strep test.  No wheezing.   Pt states some rt side abdomen pain. Pain seems to hurt more with cough but also at work would hurt. The duration of the abdomen pain is about one week. With this pain he does not report any nausea, vomiting, constipation or diarrhea. He does report history of chronic GI issues when he was younger. Describes some reflux in past. However recently this pain does not appear reflux like. Again he states more apparent when he coughs.  Recent anxiety. History of depression as well. He states the depression seems under control but the anxiety is not. Patient's PCP is Dr. Patsy Vaughn and he did not follow-up with psychiatry as she advised. He request refills of clonazepam which was written months ago. It does not appear that PCP prescribed him that.    Review of Systems  HENT: Positive for congestion, postnasal drip and sore throat. Negative for ear pain, nosebleeds, sinus pain and sinus pressure.   Respiratory: Positive for cough. Negative for chest tightness, shortness of breath and wheezing.   Cardiovascular: Negative for chest pain and palpitations.  Gastrointestinal: Negative for abdominal pain.       See history of present illness  Genitourinary: Negative for dysuria, flank pain and frequency.  Musculoskeletal: Negative for back pain, joint swelling, neck pain and neck stiffness.  Skin: Negative for rash.  Neurological: Negative for dizziness, seizures, syncope, facial  asymmetry, weakness, numbness and headaches.  Hematological: Negative for adenopathy. Does not bruise/bleed easily.  Psychiatric/Behavioral: Negative for behavioral problems and decreased concentration. The patient is nervous/anxious.        Objective:   Physical Exam  General  Mental Status - Alert. General Appearance - Well groomed. Not in acute distress.  Skin Rashes- No Rashes.  HEENT Head- Normal. Ear Auditory Canal - Left- Normal. Right - Normal.Tympanic Membrane- Left- Normal. Right- Normal. Eye Sclera/Conjunctiva- Left- Normal. Right- Normal. Nose & Sinuses Nasal Mucosa- Left-  Boggy and Congested. Right-  Boggy and  Congested.Bilateral no maxillary and  No frontal sinus pressure. Mouth & Throat Lips: Upper Lip- Normal: no dryness, cracking, pallor, cyanosis, or vesicular eruption. Lower Lip-Normal: no dryness, cracking, pallor, cyanosis or vesicular eruption. Buccal Mucosa- Bilateral- No Aphthous ulcers. Oropharynx- No Discharge or Erythema. Tonsils: Characteristics- Bilateral- mild Erythema or Congestion. Size/Enlargement- Bilateral- No enlargement. Discharge- bilateral-None.  Neck Neck- Supple. No Masses. No lymphadenopathy   Chest and Lung Exam Auscultation: Breath Sounds:-Clear even and unlabored.  Cardiovascular Auscultation:Rythm- Regular, rate and rhythm. Murmurs & Other Heart Sounds:Ausculatation of the heart reveal- No Murmurs.  Lymphatic Head & Neck General Head & Neck Lymphatics: Bilateral: Description- No Localized lymphadenopathy.   Abdomen Inspection:-Inspection Normal.  Palpation/Perucssion: Palpation and Percussion of the abdomen reveal- mild tenderness to palpation just below his right upper quadrant. No Rebound tenderness, No rigidity(Guarding) and No Palpable abdominal masses.(On straight leg lifts he reports some increased pain but no hernia appreciated.) Liver:-Normal.  Spleen:- Normal.  Assessment & Plan:  You appear to  have bronchitis with some allergy component. Rest hydrate and tylenol for fever. I am prescribing cough medicine hycodan, and augmentin antibiotic. For your nasal congestion flonase.  For pharyngitis/possible strep. Augmentin should have adequate coverage. I decided not to do a rapid strep test since treating for rhonchi [place] with Augmentin.  You should gradually get better. If not then notify us and would recommend a chest xray.  Your abdomen pain seems to be associated with the cough/onset of recent illness. On exam pain seems to be over rectus abdominal muscle. If your abdomen pain lingers, changes or worsening signs and symptoms then would start abdomen pain workup. So please keep [place] updated on level of pain and if it subsides or worsen.  For your history of anxiety and depression. I would encourage you to get back in with a psychiatrist. I will send message to Dr. Patsy Vaughn to see if she would prescribe you clonazepam. If you don't get an answer from our office by Friday morning then recommend you all back.  Follow up in 7-10 days or as needed  Todd Vaughn, Todd Vaughn, [name]

## 2017-06-01 NOTE — Telephone Encounter (Signed)
Thanks Ramon Dredge- I agree with your management.  I tried to [place]rosia pt on both listed numbers but neither works. Will send him a letter reinforcing your advice that he follow-up with psychiatry

## 2017-06-06 ENCOUNTER — Ambulatory Visit (INDEPENDENT_AMBULATORY_CARE_PROVIDER_SITE_OTHER): Payer: 59 | Admitting: Family Medicine

## 2017-06-06 ENCOUNTER — Telehealth: Payer: Self-pay | Admitting: Medical

## 2017-06-06 ENCOUNTER — Telehealth: Payer: Self-pay | Admitting: Family Medicine

## 2017-06-06 ENCOUNTER — Encounter: Payer: Self-pay | Admitting: Family Medicine

## 2017-06-06 ENCOUNTER — Ambulatory Visit (HOSPITAL_BASED_OUTPATIENT_CLINIC_OR_DEPARTMENT_OTHER)
Admission: RE | Admit: 2017-06-06 | Discharge: 2017-06-06 | Disposition: A | Discharge status: Home / Self Care | Payer: 59 | Source: Ambulatory Visit | Attending: Family Medicine | Admitting: Family Medicine

## 2017-06-06 VITALS — BP 109/82 | HR 111 | Temp 98.1°F | Resp 16 | Ht 69.0 in | Wt 166.6 lb

## 2017-06-06 DIAGNOSIS — R05 Cough: Secondary | ICD-10-CM | POA: Diagnosis not present

## 2017-06-06 DIAGNOSIS — R059 Cough, unspecified: Secondary | ICD-10-CM

## 2017-06-06 MED ORDER — HYDROCODONE-HOMATROPINE 5-1.5 MG/5ML PO SYRP: 5.0000 mL | ORAL_SOLUTION | Freq: Four times a day (QID) | ORAL | 0 refills | Status: DC | PRN

## 2017-06-06 MED FILL — HYDROCODONE-HOMATROPINE SYR: 5-1.5 | 9 days supply | Qty: 180 | Fill #0

## 2017-06-06 NOTE — Telephone Encounter (Signed)
Patient requesting Rx today, please advise

## 2017-06-06 NOTE — Telephone Encounter (Signed)
Patient did get prescription of Hycodan today after being seen. So I'm not going to fill that prescription.

## 2017-06-06 NOTE — Telephone Encounter (Signed)
Pt says that he was seen and prescribed HYCODAN Pt says that he is not feeling better. He would like to know if provider could refill medication so that he can make it through his day.  Pharmacy: [facility]

## 2017-06-06 NOTE — Patient Instructions (Signed)
Thank you for coming in,   Please try things such as zyrtec-D or allegra-D which is an antihistamine and decongestant.   Please try afrin which will help with nasal congestion but use for only three days.   Please also try using a netti pot on a regular occasion.  Honey can help with a sore throat.   We will Z[place]er you with the results from today.    Please feel free to Cha[place]cey with any questions or concerns at any time, at [phone]. --Dr. Josephene Marrone  

## 2017-06-06 NOTE — Assessment & Plan Note (Signed)
Ongoing cough that seems to be related to a viral illness. Has completed a course of Augmentin. Has a pain in the right lower rib space which could be suggestive of a stress fracture related to coughing. - Rib series and chest x-ray - Refilled Hycodan - Follow-up if no improvement

## 2017-06-06 NOTE — Progress Notes (Signed)
Todd Vaughn - 21 y.o. male MRN [id number]  Date of birth: Apr 08, 1996  SUBJECTIVE:  Including CC & ROS.  Chief Complaint  Patient presents with  . Cough    Pt reports cough x 2 weeks. Chest hurts from cough and throat is extrememly sore. Requesting refill of hycodan syrup.  thinks he has 1 day left of Augmentin and has seen no improvement to date.    Todd Vaughn is a 21 year old male that is presenting with an ongoing cough. He reports he also has right-sided rib pain. The pain is severe in nature. The cough is worse at night. He was seen a few days ago provided medications. He feels like the cough syrup helps with him the most. He still has a sore throat, drainage, and cough. He reports his girlfriend had similar symptoms. He reports having been tested for STDs last month and a [place]er[place] came back negative. 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 The pain in his right lower side is sharp in nature and very tender to touch.   Patient was seen on 9/19 and diagnosed bronchitis. He was provided Augmentin during that time.  Review of his chest x-ray from 03/22/17 shows no cardiopulmonary disease.  Review of Systems  Constitutional: Positive for chills. Negative for fever.  Respiratory: Positive for cough. Negative for shortness of breath.   Musculoskeletal: Negative for gait problem.  Skin: Negative for color change.    HISTORY: Past Medical, Surgical, Social, and Family History Reviewed & Updated per EMR.   Pertinent Historical Findings include:  Past Medical History:  Diagnosis Date  . Acute pharyngitis 01/15/2016  . ADHD (attention deficit hyperactivity disorder)   . Anxiety   . Depression   . ETOH abuse     Past Surgical History:  Procedure Laterality Date  . WISDOM TOOTH EXTRACTION      No Known Allergies  Family History  Problem Relation Age of Onset  . Healthy Mother        Living  . Healthy Father        Living  . Heart attack Maternal Grandfather   . Healthy Sister        x1    . Bone cancer Paternal Grandfather      Social History   Social History  . Marital status: Single    Spouse name: N/A  . Number of children: N/A  . Years of education: N/A   Occupational History  . Not on file.   Social History Main Topics  . Smoking status: Current Every Day Smoker    Packs/day: 0.25  . Smokeless tobacco: Never Used  . Alcohol use Yes     Comment: socially  . Drug use: Yes    Frequency: 4.0 times per week    Types: Marijuana  . Sexual activity: Yes    Birth control/ protection: Condom   Other Topics Concern  . Not on file   Social History Narrative  . No narrative on file     PHYSICAL EXAM:  VS: BP 109/82 (BP Location: Right Arm, Cuff Size: Normal)   Pulse (!) 111   Temp 98.1 F (36.7 C) (Oral)   Resp 16   Ht  (1.753 m)   Wt 166 lb 9.6 oz (75.6 kg)   SpO2 100%   BMI 24.60 kg/m  Physical Exam Gen: NAD, alert, cooperative with exam,  ENT: normal lips, normal nasal mucosa, tympanic members clear and intact bilaterally, no cervical lymphadenopathy, nasal turbinates normal, no  tonsillar exudates or hypertrophy, cobblestoning of the oropharynx  Eye: normal EOM, normal conjunctiva and lids CV:  no edema, +2 pedal pulses , S1-S2, tachycardia  Resp: no accessory muscle use, non-labored, no crackles or wheezes, clear auscultation Skin: no rashes, no areas of induration  Neuro: normal tone, normal sensation to touch Psych:  normal insight, alert and oriented MSK: Tender to palpation in the right rib in the midaxillary space. This occurred roughly between ribs 6 and 8. No step-off appreciated     ASSESSMENT & PLAN:   Cough Ongoing cough that seems to be related to a viral illness. Has completed a course of Augmentin. Has a pain in the right lower rib space which could be suggestive of a stress fracture related to coughing. - Rib series and chest x-ray - Refilled Hycodan - Follow-up if no improvement

## 2017-06-22 ENCOUNTER — Emergency Department (HOSPITAL_COMMUNITY)
Admission: EM | Admit: 2017-06-22 | Discharge: 2017-06-22 | Disposition: A | Discharge status: Home / Self Care | Payer: 59 | Attending: Emergency Medicine | Admitting: Emergency Medicine

## 2017-06-22 ENCOUNTER — Emergency Department (HOSPITAL_COMMUNITY): Payer: 59

## 2017-06-22 ENCOUNTER — Encounter (HOSPITAL_COMMUNITY): Payer: Self-pay | Admitting: Emergency Medicine

## 2017-06-22 DIAGNOSIS — T50901A Poisoning by unspecified drugs, medicaments and biological substances, accidental (unintentional), initial encounter: Secondary | ICD-10-CM | POA: Insufficient documentation

## 2017-06-22 DIAGNOSIS — F1910 Other psychoactive substance abuse, uncomplicated: Secondary | ICD-10-CM | POA: Insufficient documentation

## 2017-06-22 DIAGNOSIS — Z79899 Other long term (current) drug therapy: Secondary | ICD-10-CM | POA: Insufficient documentation

## 2017-06-22 DIAGNOSIS — F17210 Nicotine dependence, cigarettes, uncomplicated: Secondary | ICD-10-CM | POA: Insufficient documentation

## 2017-06-22 LAB — CBC WITH DIFFERENTIAL/PLATELET
BASOS PCT: 0 %
Basophils Absolute: 0 10*3/uL (ref 0.0–0.1)
Eosinophils Absolute: 0 10*3/uL (ref 0.0–0.7)
Eosinophils Relative: 0 %
HEMATOCRIT: 45.3 % (ref 39.0–52.0)
HEMOGLOBIN: 16.3 g/dL (ref 13.0–17.0)
Lymphocytes Relative: 14 %
Lymphs Abs: 1.4 10*3/uL (ref 0.7–4.0)
MCH: 32.3 pg (ref 26.0–34.0)
MCHC: 36 g/dL (ref 30.0–36.0)
MCV: 89.7 fL (ref 78.0–100.0)
MONOS PCT: 13 %
Monocytes Absolute: 1.3 10*3/uL — ABNORMAL HIGH (ref 0.1–1.0)
NEUTROS ABS: 7.3 10*3/uL (ref 1.7–7.7)
NEUTROS PCT: 73 %
Platelets: 216 10*3/uL (ref 150–400)
RBC: 5.05 MIL/uL (ref 4.22–5.81)
RDW: 12.2 % (ref 11.5–15.5)
WBC: 10.1 10*3/uL (ref 4.0–10.5)

## 2017-06-22 LAB — URINALYSIS, ROUTINE W REFLEX MICROSCOPIC
BILIRUBIN URINE: NEGATIVE
Glucose, UA: NEGATIVE mg/dL
Hgb urine dipstick: NEGATIVE
Ketones, ur: 5 mg/dL — AB
LEUKOCYTES UA: NEGATIVE
NITRITE: NEGATIVE
PH: 5 (ref 5.0–8.0)
Protein, ur: NEGATIVE mg/dL
SPECIFIC GRAVITY, URINE: 1.018 (ref 1.005–1.030)

## 2017-06-22 LAB — COMPREHENSIVE METABOLIC PANEL
ALT: 25 U/L (ref 17–63)
ANION GAP: 9 (ref 5–15)
AST: 33 U/L (ref 15–41)
Albumin: 4 g/dL (ref 3.5–5.0)
Alkaline Phosphatase: 60 U/L (ref 38–126)
BUN: 11 mg/dL (ref 6–20)
CALCIUM: 8.6 mg/dL — AB (ref 8.9–10.3)
CO2: 24 mmol/L (ref 22–32)
Chloride: 106 mmol/L (ref 101–111)
Creatinine, Ser: 1.08 mg/dL (ref 0.61–1.24)
GFR calc Af Amer: >60 mL/min (ref >60)
Glucose, Bld: 90 mg/dL (ref 65–99)
POTASSIUM: 3.5 mmol/L (ref 3.5–5.1)
Sodium: 139 mmol/L (ref 135–145)
TOTAL PROTEIN: 7.2 g/dL (ref 6.5–8.1)
Total Bilirubin: 0.4 mg/dL (ref 0.3–1.2)

## 2017-06-22 LAB — RAPID URINE DRUG SCREEN, HOSP PERFORMED
Amphetamines: NOT DETECTED
Barbiturates: NOT DETECTED
Benzodiazepines: POSITIVE — AB
Cocaine: POSITIVE — AB
OPIATES: NOT DETECTED
TETRAHYDROCANNABINOL: POSITIVE — AB

## 2017-06-22 LAB — ETHANOL

## 2017-06-22 LAB — ACETAMINOPHEN LEVEL: Acetaminophen (Tylenol), Serum: <10 ug/mL — ABNORMAL LOW (ref 10–30)

## 2017-06-22 LAB — SALICYLATE LEVEL

## 2017-06-22 MED ORDER — SODIUM CHLORIDE 0.9 % IV SOLN
INTRAVENOUS | Status: DC
  Administered 2017-06-22: 21:00:00 via INTRAVENOUS

## 2017-06-22 MED ORDER — SODIUM CHLORIDE 0.9 % IV BOLUS (SEPSIS)
1000.0000 mL | Freq: Once | INTRAVENOUS | Status: AC
  Administered 2017-06-22: 1000 mL via INTRAVENOUS

## 2017-06-22 MED ORDER — KETOROLAC TROMETHAMINE 30 MG/ML IJ SOLN
30.0000 mg | Freq: Once | INTRAMUSCULAR | Status: AC
  Administered 2017-06-22: 30 mg via INTRAVENOUS
  Filled 2017-06-22: qty 1

## 2017-06-22 NOTE — ED Provider Notes (Signed)
[facility]-EMERGENCY DEPT Provider Note   CSN: [id number] Arrival date & time: 06/22/17  1918     History   Chief Complaint Chief Complaint  Patient presents with  . Drug Overdose    HPI Todd Vaughn is a 21 y.o. male.  Pt presents to the ED today with drug overdose.  Pt said he needed to escape from some things, so he took cocaine and what he thought was xanax.  He did not want to hurt himself.  He thinks the xanax was laced with something.  His girlfriend came home and found him unresponsive and agonal this evening around 1745.  EMS was called and gave him 2 mg of narcan IM.  Pt woke up after that was given.  Pt able to walk to the ambulance.  Pt said he feels "drugged" now.      Past Medical History:  Diagnosis Date  . Acute pharyngitis 01/15/2016  . ADHD (attention deficit hyperactivity disorder)   . Anxiety   . Depression   . ETOH abuse     Patient Active Problem List   Diagnosis Date Noted  . Cough 06/06/2017  . Anxiety and depression 12/02/2014  . ADHD (attention deficit hyperactivity disorder), combined type 06/14/2014  . Cannabis use disorder, moderate, dependence (HCC) 06/14/2014    Past Surgical History:  Procedure Laterality Date  . WISDOM TOOTH EXTRACTION         Home Medications    Prior to Admission medications   Medication Sig Start Date End Date Taking? Authorizing Provider  ALPRAZolam Prudy Feeler) 1 MG tablet Take 1 mg by mouth once.   Yes [provider]  amoxicillin-clavulanate (AUGMENTIN) 875-125 MG tablet Take 1 tablet by mouth 2 (two) times daily. Patient not taking: Reported on 06/22/2017 05/31/17   Saguier, Ramon Dredge, PA-C  bismuth subsalicylate (PEPTO BISMOL) 262 MG chewable tablet Chew 524 mg by mouth daily as needed for indigestion or diarrhea or loose stools.    [provider]  fluticasone (FLONASE) 50 MCG/ACT nasal spray Place 2 sprays into both nostrils daily. 05/31/17   Saguier, Ramon Dredge, PA-C  HYDROcodone-homatropine (HYCODAN)  5-1.5 MG/5ML syrup Take 5 mLs by mouth every 6 (six) hours as needed for cough. 06/06/17   Myra Rude, MD  ondansetron (ZOFRAN ODT) 4 MG disintegrating tablet Take 1 tablet (4 mg total) by mouth every 8 (eight) hours as needed for nausea or vomiting. Patient not taking: Reported on 06/22/2017 03/23/17   Derwood Kaplan, MD    Family History Family History  Problem Relation Age of Onset  . Healthy Mother        Living  . Healthy Father        Living  . Heart attack Maternal Grandfather   . Healthy Sister        x1  . Bone cancer Paternal Grandfather     Social History Social History  Substance Use Topics  . Smoking status: Current Every Day Smoker    Packs/day: 0.25  . Smokeless tobacco: Never Used  . Alcohol use Yes     Comment: socially     Allergies   Patient has no known allergies.   Review of Systems Review of Systems  All other systems reviewed and are negative.    Physical Exam Updated Vital Signs BP 128/87   Pulse (!) 111   Temp 97.8 F (36.6 C) (Oral)   Resp (!) 27   SpO2 100%   Physical Exam  Constitutional: He is oriented to person, place, and  time. He appears well-developed and well-nourished.  HENT:  Head: Normocephalic and atraumatic.  Right Ear: External ear normal.  Left Ear: External ear normal.  Nose: Nose normal.  Mouth/Throat: Mucous membranes are dry.  Eyes: Pupils are equal, round, and reactive to light. Conjunctivae and EOM are normal.  Neck: Normal range of motion. Neck supple.  Cardiovascular: Regular rhythm, normal heart sounds and intact distal pulses.  Tachycardia present.   Pulmonary/Chest: Effort normal and breath sounds normal.  Abdominal: Soft. Bowel sounds are normal.  Musculoskeletal: Normal range of motion.  Neurological: He is alert and oriented to person, place, and time.  Skin: Skin is warm. Capillary refill takes less than 2 seconds.  Psychiatric: He has a normal mood and affect. His behavior is normal.  Judgment and thought content normal.  Nursing note and vitals reviewed.    ED Treatments / Results  Labs (all labs ordered are listed, but only abnormal results are displayed) Labs Reviewed  COMPREHENSIVE METABOLIC PANEL - Abnormal; Notable for the following:       Result Value   Calcium 8.6 (*)    All other components within normal limits  ACETAMINOPHEN LEVEL - Abnormal; Notable for the following:    Acetaminophen (Tylenol), Serum <10 (*)    All other components within normal limits  RAPID URINE DRUG SCREEN, HOSP PERFORMED - Abnormal; Notable for the following:    Cocaine POSITIVE (*)    Benzodiazepines POSITIVE (*)    Tetrahydrocannabinol POSITIVE (*)    All other components within normal limits  CBC WITH DIFFERENTIAL/PLATELET - Abnormal; Notable for the following:    Monocytes Absolute 1.3 (*)    All other components within normal limits  URINALYSIS, ROUTINE W REFLEX MICROSCOPIC - Abnormal; Notable for the following:    Ketones, ur 5 (*)    All other components within normal limits  SALICYLATE LEVEL  ETHANOL  CBG MONITORING, ED    EKG  EKG Interpretation  Date/Time:  Thursday June 22 2017 19:57:09 EDT Ventricular Rate:  115 PR Interval:    QRS Duration: 81 QT Interval:  306 QTC Calculation: 424 R Axis:   67 Text Interpretation:  Sinus tachycardia Borderline T abnormalities, inferior leads Since last tracing rate faster Confirmed by Linwood Dibbles [phone]) on 06/22/2017 8:03:50 PM       Radiology Dg Chest Portable 1 View  Result Date: 06/22/2017 CLINICAL DATA:  Unresponsive.  Possible overdose. EXAM: PORTABLE CHEST 1 VIEW COMPARISON:  9258 FINDINGS: A single AP portable view of the chest demonstrates no focal airspace consolidation or alveolar edema. The lungs are grossly clear. There is no large effusion or pneumothorax. Cardiac and mediastinal contours appear unremarkable. IMPRESSION: No active disease. Electronically Signed   By: Ellery Plunk M.D.   On:  06/22/2017 20:44    Procedures Procedures (including critical care time)  Medications Ordered in ED Medications  sodium chloride 0.9 % bolus 1,000 mL (0 mLs Intravenous Stopped 06/22/17 2050)    And  0.9 %  sodium chloride infusion ( Intravenous New Bag/Given 06/22/17 2050)  ketorolac (TORADOL) 30 MG/ML injection 30 mg (30 mg Intravenous Given 06/22/17 2155)     Initial Impression / Assessment and Plan / ED Course  I have reviewed the triage vital signs and the nursing notes.  Pertinent labs & imaging results that were available during my care of the patient were reviewed by me and considered in my medical decision making (see chart for details).   Pt has not been having any respiratory distress  while here.  He is stable for d/c.  Final Clinical Impressions(s) / ED Diagnoses   Final diagnoses:  Polysubstance abuse (HCC)  Accidental drug overdose, initial encounter    New Prescriptions New Prescriptions   No medications on file     Jacalyn Lefevre, MD 06/22/17 2246

## 2017-06-22 NOTE — ED Notes (Signed)
Poison Control called , spoke to Sargeant, Charity fundraiser. To monitor pt. In the next 2 hours from calling and if no s/s of distress noted . Pt. Is cleared .

## 2017-06-22 NOTE — ED Triage Notes (Signed)
Per EMS , pt. from home reported of drug overdose. Pt.'s girlfriend found pt. unresponsive and agonal at around 0545 this evening , GF reported to EMS  that pt. Took 1.5 tabs of xanax ,unknown dose and had a "bump and snort  of cocaine".  narcan IM given at the scene by fire Dept. Placed on  4 l/min O2 [place] . Pt. Was ambulatory towards ambulance. Alert and oriented  x4 upon arrival to ED. Denied SI/HI, stated that he just wanted that " high feeling". Calm and cooperative.

## 2017-06-22 NOTE — ED Notes (Signed)
Bed: WA20 Expected date:  Expected time:  Means of arrival:  Comments: EMS 

## 2017-07-26 ENCOUNTER — Telehealth (HOSPITAL_COMMUNITY): Payer: Self-pay

## 2018-03-20 ENCOUNTER — Other Ambulatory Visit: Payer: Self-pay

## 2018-03-20 ENCOUNTER — Emergency Department (HOSPITAL_BASED_OUTPATIENT_CLINIC_OR_DEPARTMENT_OTHER)
Admission: EM | Admit: 2018-03-20 | Discharge: 2018-03-20 | Disposition: A | Discharge status: Home / Self Care | Payer: 59 | Attending: Emergency Medicine | Admitting: Emergency Medicine

## 2018-03-20 ENCOUNTER — Encounter (HOSPITAL_BASED_OUTPATIENT_CLINIC_OR_DEPARTMENT_OTHER): Payer: Self-pay

## 2018-03-20 DIAGNOSIS — S0991XA Unspecified injury of ear, initial encounter: Secondary | ICD-10-CM | POA: Diagnosis present

## 2018-03-20 DIAGNOSIS — Z79899 Other long term (current) drug therapy: Secondary | ICD-10-CM | POA: Diagnosis not present

## 2018-03-20 DIAGNOSIS — X58XXXA Exposure to other specified factors, initial encounter: Secondary | ICD-10-CM | POA: Insufficient documentation

## 2018-03-20 DIAGNOSIS — F17200 Nicotine dependence, unspecified, uncomplicated: Secondary | ICD-10-CM | POA: Diagnosis not present

## 2018-03-20 DIAGNOSIS — H9222 Otorrhagia, left ear: Secondary | ICD-10-CM | POA: Insufficient documentation

## 2018-03-20 DIAGNOSIS — S00412A Abrasion of left ear, initial encounter: Secondary | ICD-10-CM | POA: Diagnosis not present

## 2018-03-20 DIAGNOSIS — Y9389 Activity, other specified: Secondary | ICD-10-CM | POA: Diagnosis not present

## 2018-03-20 DIAGNOSIS — Y92007 Garden or yard of unspecified non-institutional (private) residence as the place of occurrence of the external cause: Secondary | ICD-10-CM | POA: Insufficient documentation

## 2018-03-20 DIAGNOSIS — Y999 Unspecified external cause status: Secondary | ICD-10-CM | POA: Insufficient documentation

## 2018-03-20 MED ORDER — CIPROFLOXACIN-DEXAMETHASONE 0.3-0.1 % OT SUSP: 4.0000 [drp] | Freq: Two times a day (BID) | OTIC | 0 refills | Status: AC

## 2018-03-20 MED ORDER — CIPROFLOXACIN-DEXAMETHASONE 0.3-0.1 % OT SUSP
OTIC | Status: AC
  Filled 2018-03-20: qty 7.5

## 2018-03-20 MED ORDER — CIPROFLOXACIN-DEXAMETHASONE 0.3-0.1 % OT SUSP
4.0000 [drp] | Freq: Two times a day (BID) | OTIC | Status: DC
Start: 2018-03-20 — End: 2018-03-20
  Administered 2018-03-20: 4 [drp] via OTIC

## 2018-03-20 NOTE — Discharge Instructions (Signed)
You have a small cut to the ear canal, which is why you have some bleeding from the ear. DO NOT USE QTIPS OR PLACE ANYTHING INTO YOUR EAR EXCEPT FOR THE EAR DROPS PRESCRIBED. Use ear drops as directed to help prevent infection. Do not submerge your head underwater. Follow up with your regular doctor in 1 week for recheck of symptoms. Return to the ER for emergent changes or worsening symptoms.

## 2018-03-20 NOTE — ED Provider Notes (Addendum)
[facility] EMERGENCY DEPARTMENT Provider Note   CSN: [id number] Arrival date & time: 03/20/18  1829     History   Chief Complaint Chief Complaint  Patient presents with  . Ear Problem    HPI Todd Vaughn is a 22 y.o. male with a PMHx of ADHD, anxiety, and depression, who presents to the ED with complaints of spontaneous bleeding from his left ear.  Patient states that he was sitting on his car outside near a pond when he felt like there was water in his ear, he touched his ear and noticed that it was bleeding.  This occurred about 1 hour prior to arrival.  He did not try anything for symptoms, no known aggravating factors.  He denies that anything went into his ear that he is aware of, denies any trauma to the ear, and denies Q-tip use or anything that he is placed in his ear.  He has not been swimming recently.  He denies any ear pain or drainage, ear fullness, fevers, chills, URI symptoms, or any other complaints at this time.  He is not on any blood thinners.  The history is provided by the patient and medical records. No language interpreter was used.    Past Medical History:  Diagnosis Date  . Acute pharyngitis 01/15/2016  . ADHD (attention deficit hyperactivity disorder)   . Anxiety   . Depression   . ETOH abuse     Patient Active Problem List   Diagnosis Date Noted  . Cough 06/06/2017  . Anxiety and depression 12/02/2014  . ADHD (attention deficit hyperactivity disorder), combined type 06/14/2014  . Cannabis use disorder, moderate, dependence (HCC) 06/14/2014    Past Surgical History:  Procedure Laterality Date  . WISDOM TOOTH EXTRACTION          Home Medications    Prior to Admission medications   Medication Sig Start Date End Date Taking? Authorizing Provider  ALPRAZolam Prudy Feeler) 1 MG tablet Take 1 mg by mouth once.    [provider]  amoxicillin-clavulanate (AUGMENTIN) 875-125 MG tablet Take 1 tablet by mouth 2 (two) times daily. Patient  not taking: Reported on 06/22/2017 05/31/17   Saguier, Ramon Dredge, PA-C  bismuth subsalicylate (PEPTO BISMOL) 262 MG chewable tablet Chew 524 mg by mouth daily as needed for indigestion or diarrhea or loose stools.    [provider]  fluticasone (FLONASE) 50 MCG/ACT nasal spray Place 2 sprays into both nostrils daily. 05/31/17   Saguier, Ramon Dredge, PA-C  HYDROcodone-homatropine (HYCODAN) 5-1.5 MG/5ML syrup Take 5 mLs by mouth every 6 (six) hours as needed for cough. 06/06/17   Myra Rude, MD  ondansetron (ZOFRAN ODT) 4 MG disintegrating tablet Take 1 tablet (4 mg total) by mouth every 8 (eight) hours as needed for nausea or vomiting. Patient not taking: Reported on 06/22/2017 03/23/17   Derwood Kaplan, MD    Family History Family History  Problem Relation Age of Onset  . Healthy Mother        Living  . Healthy Father        Living  . Heart attack Maternal Grandfather   . Healthy Sister        x1  . Bone cancer Paternal Grandfather     Social History Social History   Tobacco Use  . Smoking status: Current Every Day Smoker    Packs/day: 0.25  . Smokeless tobacco: Never Used  Substance Use Topics  . Alcohol use: Not Currently  . Drug use: Never  Frequency: 4.0 times per week     Allergies   Patient has no known allergies.   Review of Systems Review of Systems  Constitutional: Negative for chills and fever.  HENT: Negative for ear discharge, ear pain, rhinorrhea and sore throat.        +L ear bleeding  Allergic/Immunologic: Negative for immunocompromised state.  Hematological: Does not bruise/bleed easily.     Physical Exam Updated Vital Signs BP 120/87 (BP Location: Right Arm)   Pulse 94   Temp 98.1 F (36.7 C) (Oral)   Resp 18   Ht 5\' 10"  (1.778 m)   Wt 77.2 kg (170 lb 3.1 oz)   SpO2 98%   BMI 24.42 kg/m   Physical Exam  Constitutional: He is oriented to person, place, and time. Vital signs are normal. He appears well-developed and well-nourished.   Non-toxic appearance. No distress.  Afebrile, nontoxic, NAD  HENT:  Head: Normocephalic and atraumatic.  Right Ear: Hearing, tympanic membrane, external ear and ear canal normal.  Left Ear: Hearing, tympanic membrane and external ear normal. Left ear exhibits lacerations (abrasion). No drainage, swelling or tenderness. No foreign bodies. Tympanic membrane is not injected and not bulging.  No middle ear effusion. No decreased hearing is noted.  Mouth/Throat: Mucous membranes are normal.  R ear clear. L ear with scant amount of blood to the inferior portion of the external ear canal near the canal opening, small clot noted which was removed using lighted curette, and reveals a small abrasion to the area. No canal drainage or swelling, no tenderness, TM clear and intact. No ongoing bleeding. No retained FBs noted.   Eyes: Conjunctivae and EOM are normal. Right eye exhibits no discharge. Left eye exhibits no discharge.  Neck: Normal range of motion. Neck supple.  Cardiovascular: Normal rate and intact distal pulses.  Pulmonary/Chest: Effort normal. No respiratory distress.  Abdominal: Normal appearance. He exhibits no distension.  Musculoskeletal: Normal range of motion.  Neurological: He is alert and oriented to person, place, and time. He has normal strength. No sensory deficit.  Skin: Skin is warm, dry and intact. No rash noted.  Psychiatric: He has a normal mood and affect.  Nursing note and vitals reviewed.    ED Treatments / Results  Labs (all labs ordered are listed, but only abnormal results are displayed) Labs Reviewed - No data to display  EKG None  Radiology No results found.  Procedures Procedures (including critical care time)  Medications Ordered in ED Medications  ciprofloxacin-dexamethasone (CIPRODEX) 0.3-0.1 % OTIC (EAR) suspension 4 drop (4 drops Left EAR Given 03/20/18 1956)  ciprofloxacin-dexamethasone (CIPRODEX) 0.3-0.1 % OTIC (EAR) suspension (has no  administration in time range)     Initial Impression / Assessment and Plan / ED Course  I have reviewed the triage vital signs and the nursing notes.  Pertinent labs & imaging results that were available during my care of the patient were reviewed by me and considered in my medical decision making (see chart for details).     22 y.o. male here with bleeding from L ear canal just PTA. On exam, has a small amount of blood to inferior aspect of ear canal, small clot pulled away from the area with lighted curette which reveals a small abrasion to the area. No swelling or drainage, TM clear. I suspect he got an abrasion either from an insect biting his ear canal, or possibly from something he put in his ear that he isn't admitting to. Will place on  ciprodex to prevent infection, advised avoidance of putting anything into his ear, discussed not going underwater, and f/up with PCP in 1wk for recheck. I explained the diagnosis and have given explicit precautions to return to the ER including for any other new or worsening symptoms. The patient understands and accepts the medical plan as it's been dictated and I have answered their questions. Discharge instructions concerning home care and prescriptions have been given. The patient is STABLE and is discharged to home in good condition.    Final Clinical Impressions(s) / ED Diagnoses   Final diagnoses:  Abrasion of left ear canal, initial encounter  Bleeding from left ear    ED Discharge Orders        Ordered    ciprofloxacin-dexamethasone (CIPRODEX) OTIC suspension  2 times daily     03/20/18 [address] [place], [place] 03/20/18 2014    Loren Racer, MD 03/20/18 2342

## 2018-03-20 NOTE — ED Triage Notes (Signed)
Pt state he was standing/no activity-sudden onset of left inner ear bleeding-denies injury-no bleeding at present-NAD-steady gait

## 2018-05-15 ENCOUNTER — Other Ambulatory Visit: Payer: Self-pay

## 2018-05-15 ENCOUNTER — Emergency Department (HOSPITAL_COMMUNITY)
Admission: EM | Admit: 2018-05-15 | Discharge: 2018-05-15 | Disposition: A | Discharge status: Home / Self Care | Payer: 59 | Attending: Emergency Medicine | Admitting: Emergency Medicine

## 2018-05-15 ENCOUNTER — Encounter (HOSPITAL_COMMUNITY): Payer: Self-pay | Admitting: Emergency Medicine

## 2018-05-15 DIAGNOSIS — F1120 Opioid dependence, uncomplicated: Secondary | ICD-10-CM | POA: Insufficient documentation

## 2018-05-15 DIAGNOSIS — F419 Anxiety disorder, unspecified: Secondary | ICD-10-CM | POA: Diagnosis not present

## 2018-05-15 DIAGNOSIS — F17200 Nicotine dependence, unspecified, uncomplicated: Secondary | ICD-10-CM | POA: Diagnosis not present

## 2018-05-15 DIAGNOSIS — Z79899 Other long term (current) drug therapy: Secondary | ICD-10-CM | POA: Insufficient documentation

## 2018-05-15 DIAGNOSIS — F902 Attention-deficit hyperactivity disorder, combined type: Secondary | ICD-10-CM | POA: Insufficient documentation

## 2018-05-15 DIAGNOSIS — F329 Major depressive disorder, single episode, unspecified: Secondary | ICD-10-CM | POA: Insufficient documentation

## 2018-05-15 DIAGNOSIS — F1010 Alcohol abuse, uncomplicated: Secondary | ICD-10-CM | POA: Insufficient documentation

## 2018-05-15 DIAGNOSIS — F1110 Opioid abuse, uncomplicated: Secondary | ICD-10-CM

## 2018-05-15 MED ORDER — ONDANSETRON 8 MG PO TBDP: 8.0000 mg | ORAL_TABLET | Freq: Three times a day (TID) | ORAL | 0 refills | Status: DC | PRN

## 2018-05-15 MED ORDER — CLONIDINE HCL 0.1 MG PO TABS: 0.1000 mg | ORAL_TABLET | Freq: Four times a day (QID) | ORAL | 0 refills | Status: DC | PRN

## 2018-05-15 NOTE — ED Triage Notes (Signed)
Pt arriving POV requesting assistance with detox from heroin. Pt reports addiction for several years. Last used around 1pm.

## 2018-05-15 NOTE — ED Provider Notes (Signed)
[facility]-EMERGENCY DEPT Provider Note   CSN: [id number] Arrival date & time: 05/15/18  0244     History   Chief Complaint Chief Complaint  Patient presents with  . Detox    HPI Todd Vaughn is a 22 y.o. male.  HPI Patient is a 22 year old male who presents to the emergency department with complaints of heroin addiction and request for assistance with a detox facility.  He has been staying at the [facility] and tonight he let them know that despite being at the [place] [facility] he is continued to use heroin.  They stated he could come back to the [facility] if he went to a detox center for 3 to 7 days.  He is requesting detox.  He is concerned and focused on having a stable living situation.   Past Medical History:  Diagnosis Date  . Acute pharyngitis 01/15/2016  . ADHD (attention deficit hyperactivity disorder)   . Anxiety   . Depression   . ETOH abuse     Patient Active Problem List   Diagnosis Date Noted  . Cough 06/06/2017  . Anxiety and depression 12/02/2014  . ADHD (attention deficit hyperactivity disorder), combined type 06/14/2014  . Cannabis use disorder, moderate, dependence (HCC) 06/14/2014    Past Surgical History:  Procedure Laterality Date  . WISDOM TOOTH EXTRACTION          Home Medications    Prior to Admission medications   Medication Sig Start Date End Date Taking? Authorizing Provider  ALPRAZolam Prudy Feeler) 1 MG tablet Take 1 mg by mouth once.    [provider]  cloNIDine (CATAPRES) 0.1 MG tablet Take 1 tablet (0.1 mg total) by mouth every 6 (six) hours as needed (withdrawl symptoms). 05/15/18   Azalia Bilis, MD  fluticasone (FLONASE) 50 MCG/ACT nasal spray Place 2 sprays into both nostrils daily. 05/31/17   Saguier, Ramon Dredge, PA-C  ondansetron (ZOFRAN ODT) 8 MG disintegrating tablet Take 1 tablet (8 mg total) by mouth every 8 (eight) hours as needed for nausea or vomiting. 05/15/18   Azalia Bilis, MD    Family  History Family History  Problem Relation Age of Onset  . Healthy Mother        Living  . Healthy Father        Living  . Heart attack Maternal Grandfather   . Healthy Sister        x1  . Bone cancer Paternal Grandfather     Social History Social History   Tobacco Use  . Smoking status: Current Every Day Smoker    Packs/day: 0.25  . Smokeless tobacco: Never Used  Substance Use Topics  . Alcohol use: Not Currently  . Drug use: Never    Frequency: 4.0 times per week     Allergies   Patient has no known allergies.   Review of Systems Review of Systems  All other systems reviewed and are negative.    Physical Exam Updated Vital Signs BP 129/84   Pulse 82   Temp 98.6 F (37 C) (Oral)   Resp 14   SpO2 97%   Physical Exam  Constitutional: He is oriented to person, place, and time. He appears well-developed and well-nourished.  HENT:  Head: Normocephalic.  Eyes: EOM are normal.  Neck: Normal range of motion.  Pulmonary/Chest: Effort normal.  Abdominal: He exhibits no distension.  Musculoskeletal: Normal range of motion.  Neurological: He is alert and oriented to person, place, and time.  Psychiatric: He has  a normal mood and affect.  Nursing note and vitals reviewed.    ED Treatments / Results  Labs (all labs ordered are listed, but only abnormal results are displayed) Labs Reviewed - No data to display  EKG None  Radiology No results found.  Procedures Procedures (including critical care time)  Medications Ordered in ED Medications - No data to display   Initial Impression / Assessment and Plan / ED Course  I have reviewed the triage vital signs and the nursing notes.  Pertinent labs & imaging results that were available during my care of the patient were reviewed by me and considered in my medical decision making (see chart for details).     Outpatient medications given for opioid withdrawal.  Outpatient resources provided to the patient.   No indication for acute hospitalization.  Unable to place in acute detox from the emergency department  Final Clinical Impressions(s) / ED Diagnoses   Final diagnoses:  Heroin abuse [facility])    ED Discharge Orders         Ordered    cloNIDine (CATAPRES) 0.1 MG tablet  Every 6 hours PRN,   Status:  Discontinued     05/15/18 0316    ondansetron (ZOFRAN ODT) 8 MG disintegrating tablet  Every 8 hours PRN     05/15/18 0316    cloNIDine (CATAPRES) 0.1 MG tablet  Every 6 hours PRN     05/15/18 0317           Azalia Bilis, MD 05/15/18 0321

## 2018-05-28 ENCOUNTER — Telehealth: Payer: Self-pay

## 2018-05-28 NOTE — Telephone Encounter (Signed)
Patient scheduled for Monday to see provider. Sima MatasRuth Sierra CMA triaged patient advised him to go to ED if symptoms worsen or he has SI.

## 2018-05-28 NOTE — Telephone Encounter (Signed)
Copied from CRM [id number]. Topic: Appointment Scheduling - Same Day Appointment >> May 28, 2018 10:27 AM Burchel, Todd Vaughn wrote: Pt needs same day appt with Dr Patsy Lageropland for depression/anxierty.  Please [place] [phone] to schedule.
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
## 2018-06-04 ENCOUNTER — Telehealth: Payer: Self-pay

## 2018-06-04 ENCOUNTER — Encounter: Payer: Self-pay | Admitting: Family Medicine

## 2018-06-04 ENCOUNTER — Ambulatory Visit (INDEPENDENT_AMBULATORY_CARE_PROVIDER_SITE_OTHER): Payer: 59 | Admitting: Family Medicine

## 2018-06-04 VITALS — BP 108/70 | HR 86 | Resp 16 | Ht 70.0 in | Wt 166.0 lb

## 2018-06-04 DIAGNOSIS — F1123 Opioid dependence with withdrawal: Secondary | ICD-10-CM

## 2018-06-04 DIAGNOSIS — F331 Major depressive disorder, recurrent, moderate: Secondary | ICD-10-CM

## 2018-06-04 DIAGNOSIS — F1193 Opioid use, unspecified with withdrawal: Secondary | ICD-10-CM

## 2018-06-04 MED ORDER — DESVENLAFAXINE SUCCINATE ER 50 MG PO TB24: 50.0000 mg | ORAL_TABLET | Freq: Every day | ORAL | 3 refills | Status: AC

## 2018-06-04 MED ORDER — CLONIDINE HCL 0.1 MG PO TABS: 0.1000 mg | ORAL_TABLET | Freq: Four times a day (QID) | ORAL | 1 refills | Status: AC | PRN

## 2018-06-04 MED ORDER — CLONIDINE HCL 0.1 MG PO TABS: 0.1000 mg | ORAL_TABLET | Freq: Four times a day (QID) | ORAL | 1 refills | Status: DC | PRN

## 2018-06-04 MED ORDER — NALOXONE HCL 4 MG/0.1ML NA LIQD: NASAL | 99 refills | Status: AC

## 2018-06-04 NOTE — Telephone Encounter (Signed)
PA initiated via Covermymeds; KEY: [id number]. Awaiting determination.

## 2018-06-04 NOTE — Patient Instructions (Signed)
It was good to see you today- take care and I hope that you feel better soon Please think about doing suboxone or another rehab program to help with your drug addiction problem We started pristiq 50 mg today-  I hope that this will help with your depression and anxiety as well.   Please come and see me in 6 weeks to check on how you are doing  If you are not ok or not safe in the meantime please seek emergency care

## 2018-06-04 NOTE — Telephone Encounter (Signed)
PA approved.

## 2018-08-05 IMAGING — CR DG ABDOMEN ACUTE W/ 1V CHEST
3 series · 3 of 3 positions shown · non-contrast
Comparison: Chest radiograph dated 12/30/2015

CLINICAL DATA: 20-year-old male with chest and abdominal pain.

EXAM:
DG ABDOMEN ACUTE W/ 1V CHEST

[w chest pa]
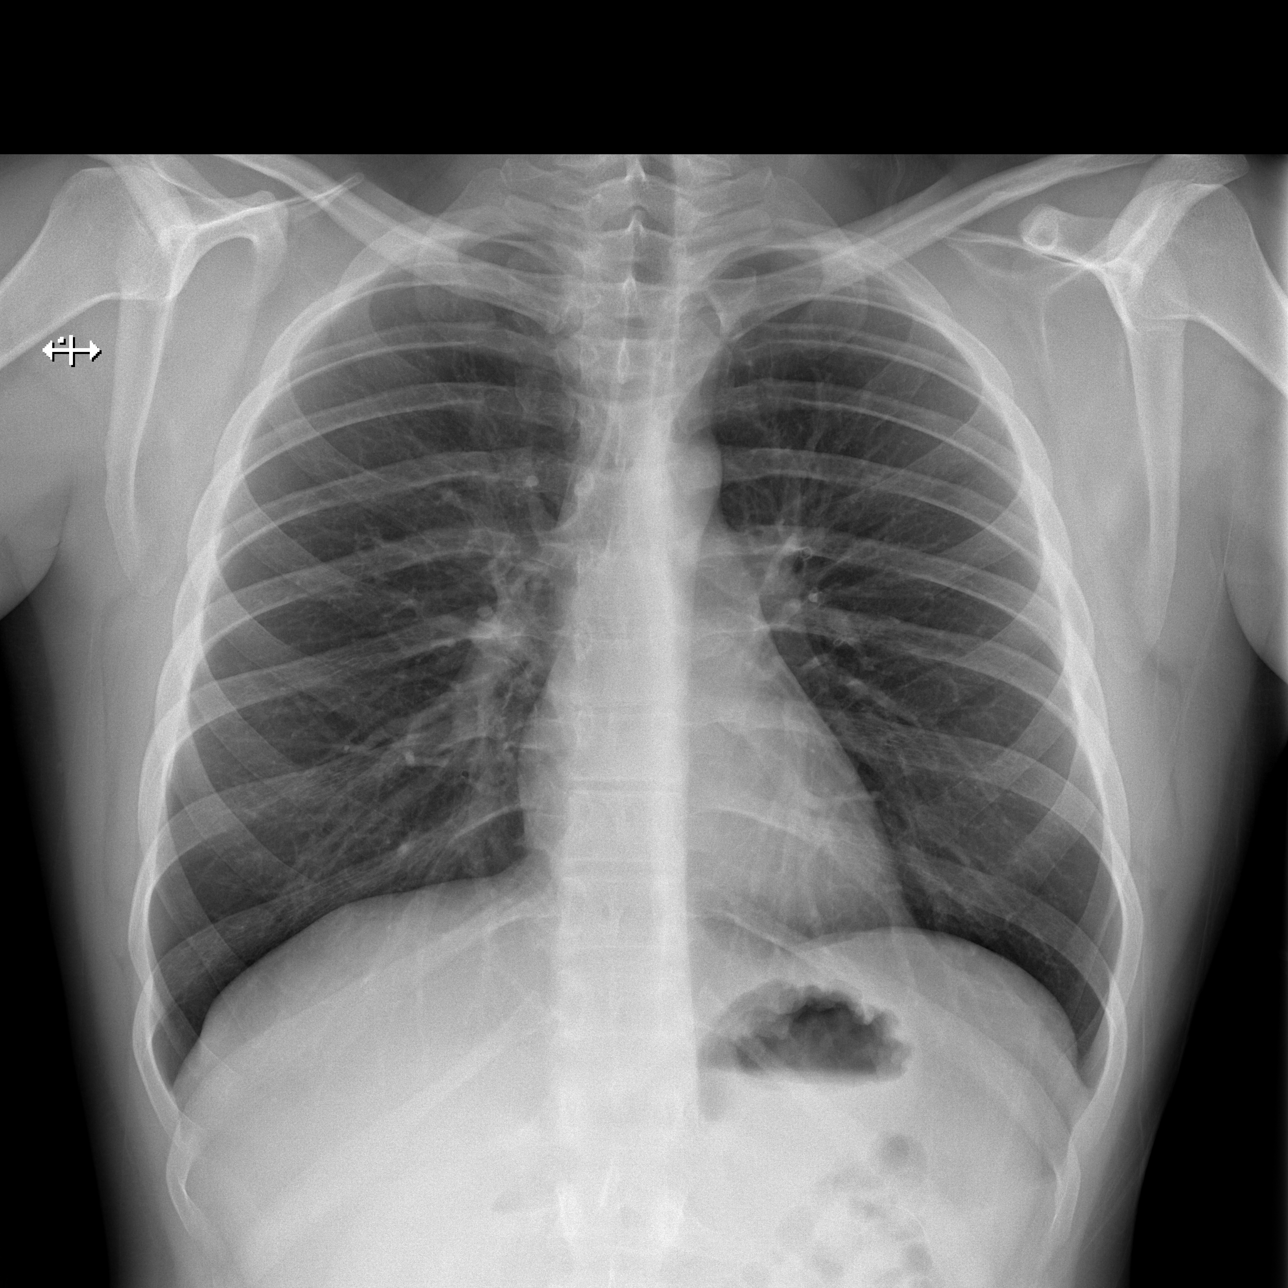

[w abdomen upright]
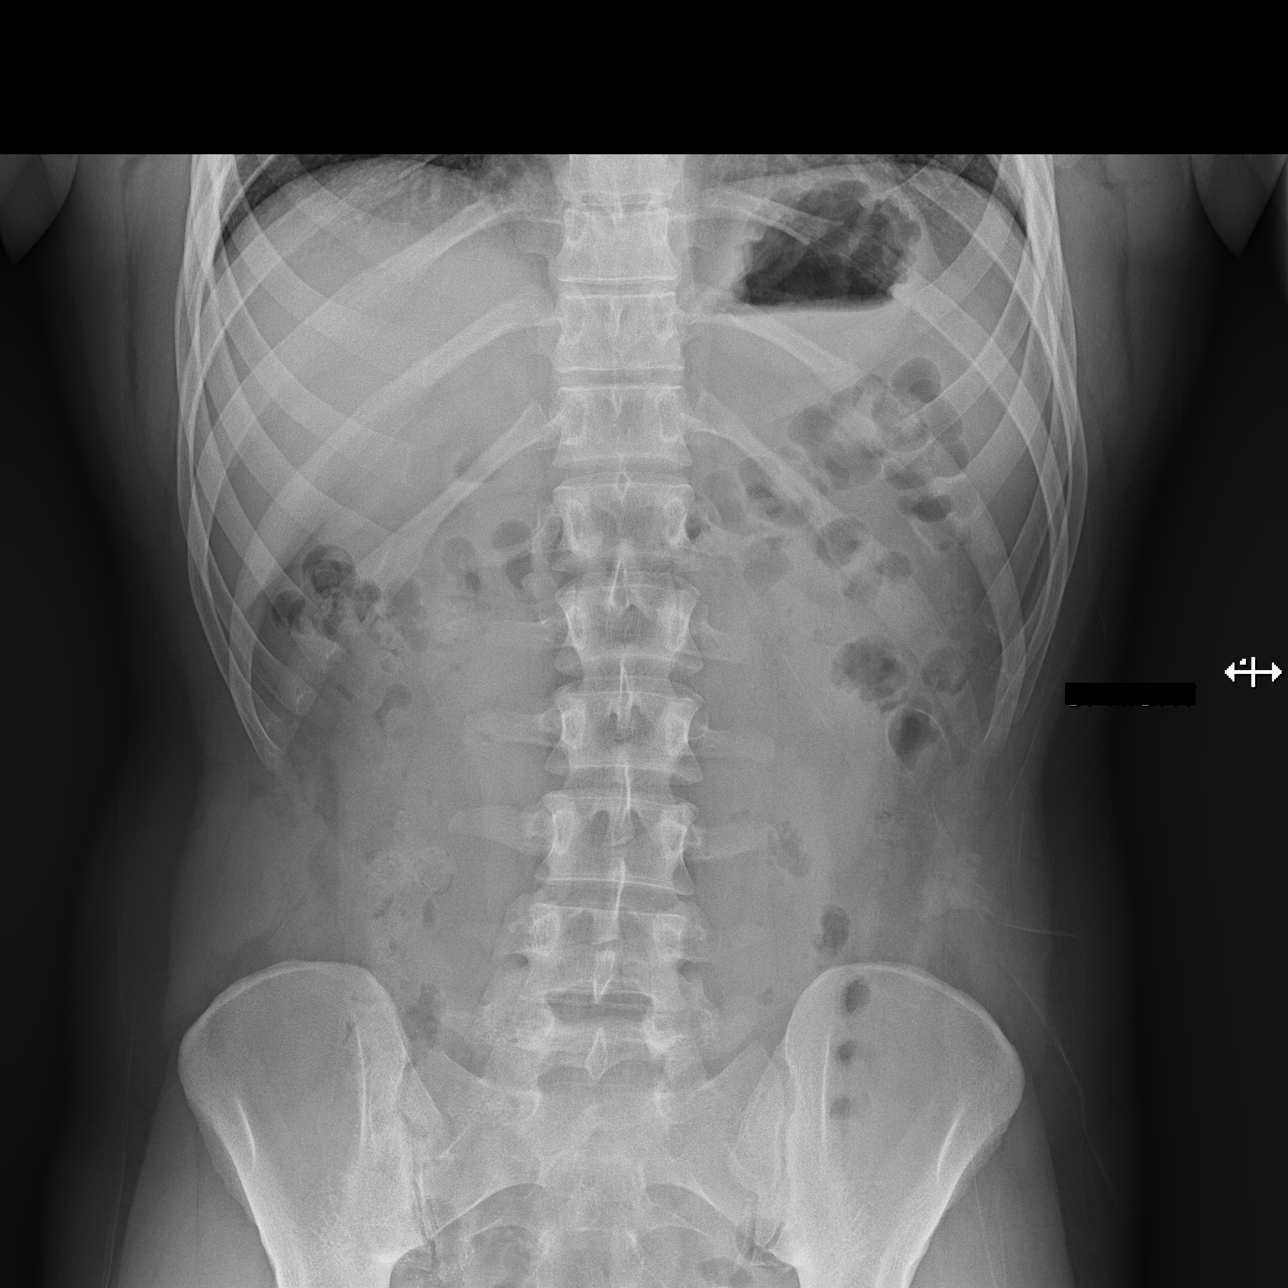

[t abdomen supine]
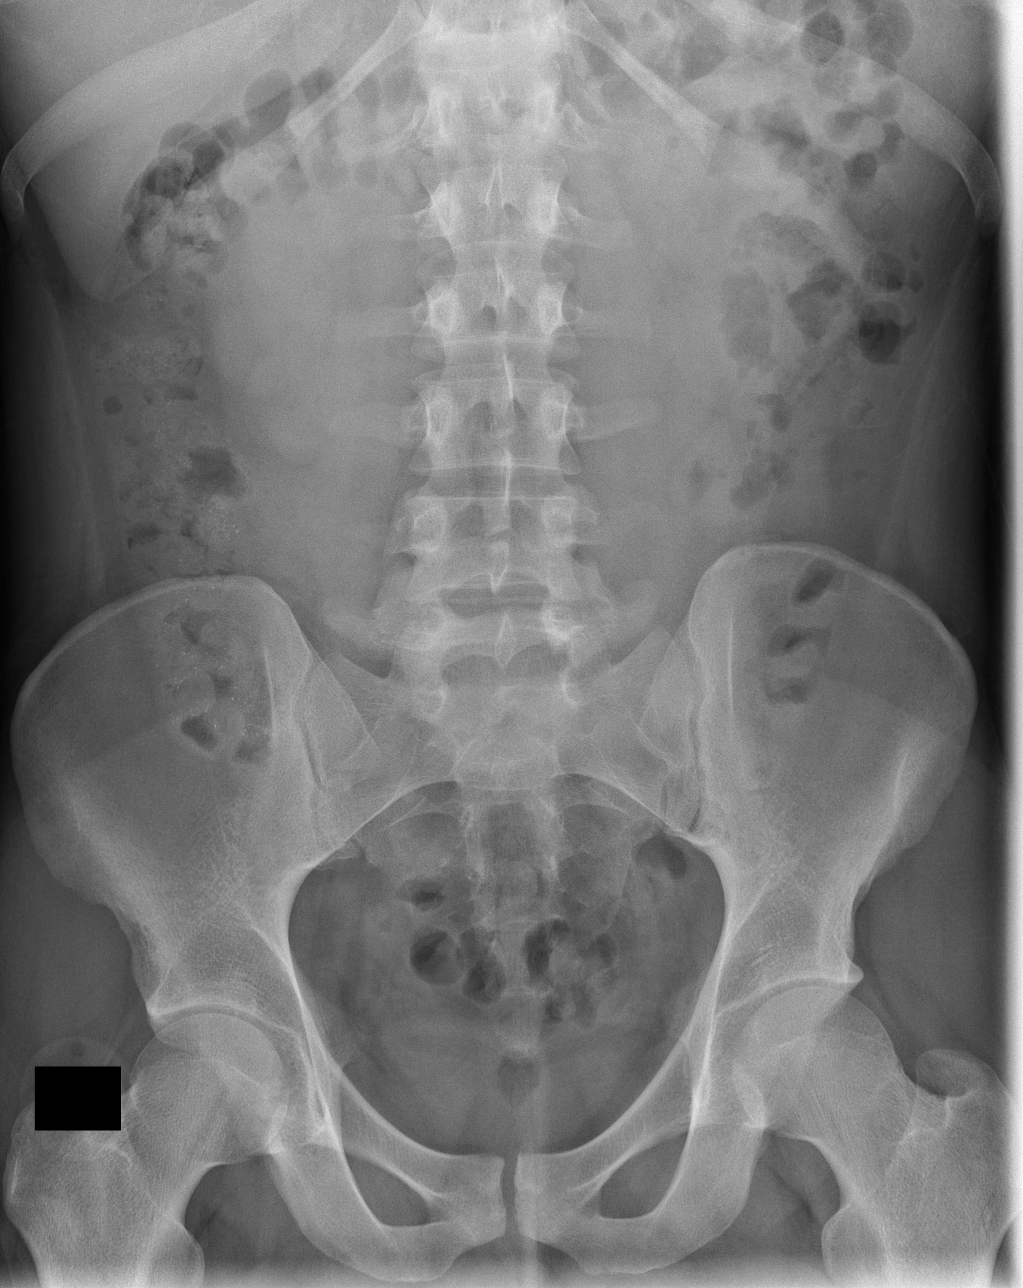

[3 of 3 positions shown; findings below may reference images not displayed]

FINDINGS: The lungs are clear. There is no pleural effusion or pneumothorax.
The cardiac silhouette is within normal limits. No acute osseous
pathology.

There is no bowel dilatation or evidence of obstruction. No free air
or radiopaque calculi. The soft tissues and osseous structures
appear unremarkable.
IMPRESSION: Negative abdominal radiographs.  No acute cardiopulmonary disease.

## 2018-11-05 IMAGING — DX DG CHEST 1V PORT
1 series · 1 of 1 positions shown · non-contrast
Comparison: 9608

CLINICAL DATA: Unresponsive.  Possible overdose.

EXAM:
PORTABLE CHEST 1 VIEW

[chest ap]
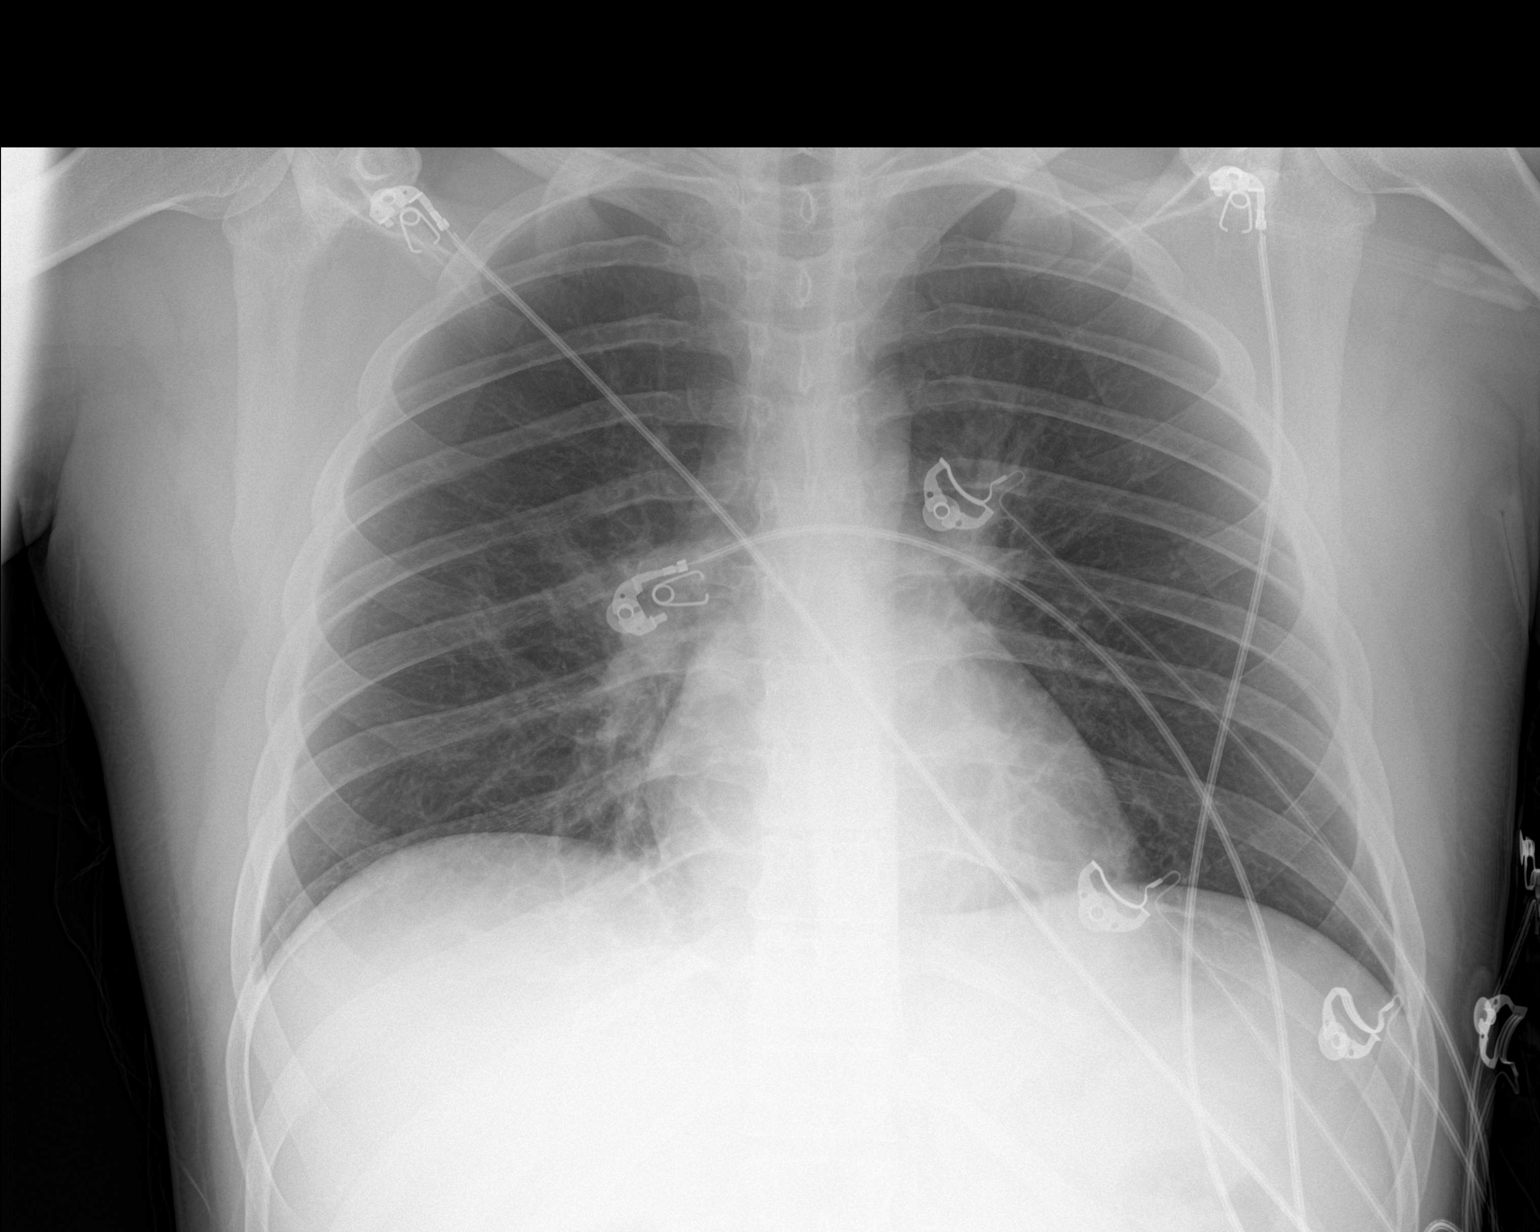

[1 of 1 positions shown; findings below may reference images not displayed]

FINDINGS: A single AP portable view of the chest demonstrates no focal
airspace consolidation or alveolar edema. The lungs are grossly
clear. There is no large effusion or pneumothorax. Cardiac and
mediastinal contours appear unremarkable.
IMPRESSION: No active disease.
